# Patient Record
Sex: Male | Born: 1960 | Race: White | Hispanic: No | Marital: Married | Smoking: Former smoker
Health system: Southern US, Community
[De-identification: ages and names within clinical notes are randomized; demographics above are authoritative.]

## PROBLEM LIST (undated history)

## (undated) DIAGNOSIS — L985 Mucinosis of the skin: Secondary | ICD-10-CM

## (undated) DIAGNOSIS — D472 Monoclonal gammopathy: Secondary | ICD-10-CM

## (undated) DIAGNOSIS — R011 Cardiac murmur, unspecified: Secondary | ICD-10-CM

## (undated) DIAGNOSIS — R0602 Shortness of breath: Secondary | ICD-10-CM

## (undated) DIAGNOSIS — I340 Nonrheumatic mitral (valve) insufficiency: Secondary | ICD-10-CM

## (undated) DIAGNOSIS — I358 Other nonrheumatic aortic valve disorders: Secondary | ICD-10-CM

## (undated) HISTORY — DX: Shortness of breath: R06.02

## (undated) HISTORY — DX: Cardiac murmur, unspecified: R01.1

## (undated) HISTORY — DX: Mucinosis of the skin: L98.5

## (undated) HISTORY — DX: Nonrheumatic mitral (valve) insufficiency: I34.0

## (undated) HISTORY — DX: Monoclonal gammopathy: D47.2

## (undated) HISTORY — DX: Other nonrheumatic aortic valve disorders: I35.8

---

## 1992-03-31 HISTORY — PX: KNEE ARTHROSCOPY: SUR90

## 2002-12-05 ENCOUNTER — Emergency Department (HOSPITAL_COMMUNITY): Admission: EM | Admit: 2002-12-05 | Discharge: 2002-12-05 | Payer: Self-pay | Admitting: Emergency Medicine

## 2002-12-05 ENCOUNTER — Encounter: Payer: Self-pay | Admitting: Emergency Medicine

## 2013-08-01 ENCOUNTER — Other Ambulatory Visit: Payer: Self-pay | Admitting: Family Medicine

## 2013-08-01 DIAGNOSIS — M799 Soft tissue disorder, unspecified: Secondary | ICD-10-CM

## 2013-08-05 ENCOUNTER — Other Ambulatory Visit: Payer: Self-pay

## 2013-08-08 ENCOUNTER — Ambulatory Visit
Admission: RE | Admit: 2013-08-08 | Discharge: 2013-08-08 | Disposition: A | Discharge status: Home / Self Care | Payer: BC Managed Care – PPO | Source: Ambulatory Visit | Attending: Family Medicine | Admitting: Family Medicine

## 2013-08-08 ENCOUNTER — Other Ambulatory Visit: Payer: Self-pay | Admitting: Family Medicine

## 2013-08-08 DIAGNOSIS — M799 Soft tissue disorder, unspecified: Secondary | ICD-10-CM

## 2013-08-08 MED ORDER — IOHEXOL 300 MG/ML  SOLN
125.0000 mL | Freq: Once | INTRAMUSCULAR | Status: AC | PRN
  Administered 2013-08-08: 125 mL via INTRAVENOUS

## 2015-10-13 ENCOUNTER — Other Ambulatory Visit: Payer: Self-pay | Admitting: Family Medicine

## 2015-10-13 DIAGNOSIS — R270 Ataxia, unspecified: Secondary | ICD-10-CM

## 2015-10-29 ENCOUNTER — Ambulatory Visit
Admission: RE | Admit: 2015-10-29 | Discharge: 2015-10-29 | Disposition: A | Discharge status: Home / Self Care | Payer: 59 | Source: Ambulatory Visit | Attending: Family Medicine | Admitting: Family Medicine

## 2015-10-29 DIAGNOSIS — R270 Ataxia, unspecified: Secondary | ICD-10-CM

## 2017-07-30 ENCOUNTER — Ambulatory Visit
Admission: RE | Admit: 2017-07-30 | Discharge: 2017-07-30 | Disposition: A | Discharge status: Home / Self Care | Payer: 59 | Source: Ambulatory Visit | Attending: Family Medicine | Admitting: Family Medicine

## 2017-07-30 ENCOUNTER — Other Ambulatory Visit: Payer: Self-pay | Admitting: Family Medicine

## 2017-07-30 DIAGNOSIS — M25512 Pain in left shoulder: Secondary | ICD-10-CM

## 2017-09-29 ENCOUNTER — Ambulatory Visit
Admission: RE | Admit: 2017-09-29 | Discharge: 2017-09-29 | Disposition: A | Discharge status: Home / Self Care | Payer: 59 | Source: Ambulatory Visit | Attending: Family Medicine | Admitting: Family Medicine

## 2017-09-29 ENCOUNTER — Other Ambulatory Visit: Payer: Self-pay | Admitting: Family Medicine

## 2017-09-29 DIAGNOSIS — M79675 Pain in left toe(s): Secondary | ICD-10-CM

## 2017-10-08 ENCOUNTER — Ambulatory Visit (INDEPENDENT_AMBULATORY_CARE_PROVIDER_SITE_OTHER): Payer: 59 | Admitting: Podiatry

## 2017-10-08 ENCOUNTER — Ambulatory Visit: Payer: Self-pay

## 2017-10-08 DIAGNOSIS — S92502A Displaced unspecified fracture of left lesser toe(s), initial encounter for closed fracture: Secondary | ICD-10-CM | POA: Diagnosis not present

## 2017-10-08 DIAGNOSIS — M84378A Stress fracture, left toe(s), initial encounter for fracture: Secondary | ICD-10-CM

## 2017-10-21 NOTE — Progress Notes (Signed)
  Subjective:  Patient ID: Kirk Hayes, male    DOB: 10-02-1960,  MRN: [id number]  No chief complaint on file.  57 y.o. male presents with the above complaint.  States that he fractured his fourth and fifth toes of the left foot around 1 June.  States it is not getting better and still hurts.  States he hit his foot on table leg No past medical history on file. No current outpatient medications on file.  Allergies not on file Review of Systems: Negative except as noted in the HPI. Denies N/V/F/Ch. Objective:  There were no vitals filed for this visit. General AA&O x3. Normal mood and affect.  Vascular Dorsalis pedis and posterior tibial pulses  present 2+ bilaterally  Capillary refill normal to all digits. Pedal hair growth normal.  Neurologic Epicritic sensation grossly present.  Dermatologic No open lesions. Interspaces clear of maceration. Nails well groomed and normal in appearance.  Orthopedic: MMT 5/5 in dorsiflexion, plantarflexion, inversion, and eversion. Normal joint ROM without pain or crepitus. Pain palpation about the left fourth fifth toes   Assessment & Plan:  Patient was evaluated and treated and all questions answered.  Closed fracture left fourth fifth toes -X-rays reviewed with patient -Discussed buddy taping until pain resolves.  Return in about 6 weeks (around 11/19/2017) for Closed fracture L 4th/5th toes.Marland Kitchen

## 2017-11-19 ENCOUNTER — Ambulatory Visit: Payer: 59 | Admitting: Podiatry

## 2018-02-05 ENCOUNTER — Ambulatory Visit (INDEPENDENT_AMBULATORY_CARE_PROVIDER_SITE_OTHER): Payer: 59 | Admitting: Psychology

## 2018-02-05 DIAGNOSIS — F4323 Adjustment disorder with mixed anxiety and depressed mood: Secondary | ICD-10-CM

## 2018-02-19 ENCOUNTER — Ambulatory Visit: Payer: 59 | Admitting: Psychology

## 2018-04-12 DIAGNOSIS — I340 Nonrheumatic mitral (valve) insufficiency: Secondary | ICD-10-CM

## 2018-04-12 DIAGNOSIS — L985 Mucinosis of the skin: Secondary | ICD-10-CM

## 2018-04-12 NOTE — H&P (Signed)
Kirk Hayes 2018-04-08 10:00 AM Location: [facility] Patient #: [id number] DOB: [birth year] Married / Language: Kirk Hayes / Race: White Male   History of Present Illness [name] MD; 08-Apr-2018 10:40 AM) Patient words: f/u 1 yhear, mitral regurgitation, 2018-04-08, tests.  The patient is a 58 year old male who presents for a follow-up for Mitral regurgitation. 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  Recent echocardiogram on 03/15/2018 showed no significant change in severity of grade 3 mitral regurgitation. However, LVEF is mildly reduced around 50%. Patient continues to exercise intermittently using stationary bike and weightlifting. He occasionally walks with his doctor. He has stable exertional dyspnea but no significant change. He has had issues with his scleromyxedema with increase in rash on his skin. He currently sees Dr. Joan Mayans at [facility] for this. He has previously been treated with Revlimid and IVIG.   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 Normal axis. Normal conduction. Borderline left atrial enlargement. Otherwise normal EKG. Echocardiogram 03/15/2018: Left ventricle cavity is normal in size. LVID 3.9 cm. Mild concentric hypertrophy of the left ventricle. Mild decrease in global wall motion. Calculated EF 49%. Left atrial cavity is mildly dilated. Posterior leaflet with prolapse with eccentric, anteriorly directed, moderate (Grade III) mitral regurgitation. Mild tricuspid regurgitation. Estimated pulmonary artery systolic pressure 24 mmHg. Insignificant pericardial effusion. IVC is dilated with  respiratory variation. Estimated RA pressure 8 mmHg. Compared to previous study on 03/20/2017, while there is no significant change in severity of mitral regurgitation, LVEF is mildly reduced. Consider TEE if clinically indicated. Shortness of breath on exertion (R06.02)   Allergies Kirk Hayes [place]; Apr 08, 2018 9:58 AM) No Known Drug Allergies [09/03/2015]:  Family History Kirk Hayes; 04/08/18 9:58 AM) Mother  Deceased. at age 59; no heart issues Father  Deceased. In 07/08/99; from suicide; no signs of cardiovascular conditions Sister 59  11 years older; no signs of cardiovascular conditions Brother 1  Deceased. 73 years older; died of an overdose; doesn't know if he had any cardiovascular conditions  Social History Kirk Hayes; 04-08-2018 9:58 AM) Current tobacco use  Former smoker. Quit: 03/31/1993 Alcohol Use  Moderate alcohol use. 2-3 drinks a night Marital status  Married. Living Situation  Lives with spouse. Number of Children  3.  Past Surgical History Kirk Hayes; 2018/04/08 9:58 AM) Arthroscopic Knee Surgery - Right  25 years ago  Medication History Kirk Hayes; 04/08/2018 10:02 AM) Vitamin C (100MG  Tablet, 1 Oral daily) Active. Multivitamin Men 50+ (1 Oral daily) Active. Fish Oil + D3 (1 Oral daily) Specific strength unknown - Active. Calcium (1 Oral daily) Specific strength unknown - Active. B Complex 50 (1 Oral daily) Specific strength unknown - Active. Ativan (1MG  Tablet, 1 Oral as needed) Active. Medications Reconciled (verbally with patient)  Diagnostic Studies History Baxter Flattery [place]; 08-Apr-2018 8:20 AM) Echocardiogram  Echocardiogram 03/15/2018: Left ventricle cavity is normal in size. LVID 3.9 cm. Mild concentric hypertrophy of the left ventricle. Mild decrease in global wall motion. Calculated EF 49%. Left atrial cavity is mildly dilated. Posterior leaflet with prolapse with eccentric, anteriorly directed, moderate (Grade III) mitral  regurgitation. Mild tricuspid regurgitation. Estimated pulmonary artery systolic pressure 24 mmHg. Insignificant pericardial effusion. IVC is dilated with respiratory variation. Estimated RA  pressure 8 mmHg. Compared to previous study on 03/20/2017, while there is no significant change in severity of mitral regurgitation, LVEF is mildly reduced. Consider TEE if clinically indicated.    Review of Systems Joya Gaskins Esther Hardy MD; 03/25/2018 10:43 AM) General Not Present- Anorexia and Fever. Skin Present- Rash. Respiratory Not Present- Cough and Difficulty Breathing on Exertion. Cardiovascular Not Present- Chest Pain, Claudications, Edema, Orthopnea, Palpitations and Paroxysmal Nocturnal Dyspnea. Gastrointestinal Not Present- Black, Tarry Stool, Change in Bowel Habits and Nausea. Neurological Not Present- Syncope. Endocrine Not Present- Cold Intolerance, Excessive Sweating, Heat Intolerance and Thyroid Problems. Hematology Not Present- Anemia, Easy Bruising, Petechiae and Prolonged Bleeding.  Vitals Kirk Hayes [place]; 03/25/2018 10:05 AM) 03/25/2018 10:00 AM Weight: 257.13 lb Height: 72in Body Surface Area: 2.37 m Body Mass Index: 34.87 kg/m  Hayes: 78 (Regular)  P.OX: 96% (Room air) BP: 117/74 (Sitting, Left Arm, Standard)       Physical Exam [name] MD; 03/25/2018 10:44 AM) General Mental Status-Alert. General Appearance-Cooperative and Appears stated age. Build & Nutrition-Moderately built and Moderately obese.  Head and Neck Face -Note: Papular rash near eyelids.  Thyroid Gland Characteristics - normal size and consistency and no palpable nodules.  Chest and Lung Exam Chest and lung exam reveals -quiet, even and easy respiratory effort with no use of accessory muscles, non-tender and on auscultation, normal breath sounds, no adventitious sounds.  Cardiovascular Cardiovascular examination reveals -carotid auscultation reveals no bruits,  abdominal aorta auscultation reveals no bruits and no prominent pulsation and femoral artery auscultation bilaterally reveals normal pulses, no bruits, no thrills. Auscultation Heart Sounds - S1 WNL and S2 WNL. Murmurs & Other Heart Sounds: Murmur - Location - Apex. Timing - Holosystolic. Grade - III/VI. Character - Blowing. Radiation - Left axilla.  Abdomen Palpation/Percussion Normal exam - Non Tender and No hepatosplenomegaly.  Peripheral Vascular Lower Extremity Palpation - Dorsalis pedis Hayes - Bilateral - 2+. Posterior tibial Hayes - Bilateral - 2+. Carotid arteries - Bilateral-No Carotid bruit.  Neurologic Neurologic evaluation reveals -alert and oriented x 3 with no impairment of recent or remote memory. Motor-Grossly intact without any focal deficits.  Musculoskeletal Global Assessment Left Lower Extremity - no deformities, masses or tenderness, no known fractures. Right Lower Extremity - no deformities, masses or tenderness, no known fractures.   Results [name] MD; 03/25/2018 10:44 AM) Procedures  Name Value Date Echocardiography, transthoracic, real-time with image documentation (2D), includes M-mode recording, when performed, complete, with spectral Doppler echocardiography, and with color flow Doppler echocardiography (65993) : CAD Comments: Echocardiogram 03/15/2018: Left ventricle cavity is normal in size. LVID 3.9 cm. Mild concentric hypertrophy of the left ventricle. Mild decrease in global wall motion. Calculated EF 49%. Left atrial cavity is mildly dilated. Posterior leaflet with prolapse with eccentric, anteriorly directed, moderate (Grade III) mitral regurgitation. Mild tricuspid regurgitation. Estimated pulmonary artery systolic pressure 24 mmHg. Insignificant pericardial effusion. IVC is dilated with respiratory variation. Estimated RA pressure 8 mmHg. Compared to previous study on 03/20/2017, while there is no significant change in  severity of mitral regurgitation, LVEF is mildly reduced. Consider TEE if clinically indicated.  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 Normal axis. Normal conduction. Normal EKG.  Echocardiogram 03/15/2018: Left ventricle cavity is normal in size. LVID 3.9 cm. Mild concentric hypertrophy of the left ventricle. Mild decrease in global wall motion. Calculated EF 49%. Left atrial cavity is  mildly dilated. Posterior leaflet with prolapse with eccentric, anteriorly directed, moderate (Grade III) mitral regurgitation. Mild tricuspid regurgitation. Estimated pulmonary artery systolic pressure 24 mmHg. Insignificant pericardial effusion. IVC is dilated with respiratory variation. Estimated RA pressure 8 mmHg. Compared to previous study on 03/20/2017, while there is no significant change in severity of mitral regurgitation, LVEF is mildly reduced. Consider TEE if clinically indicated. 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 Mild stable exertional dyspnea. Mild icnrease in LVEF compared to previous echocardiogram in 02/2017. Given this change, may need to consider mitral valve repair surgery, if anatomy is suitable. I will perform TEE and refer him to cardiothoracic surgeon Dr. Roxy Manns to see if he would be a candidate for minimally invasive mitral valve repair surgery. If so, he will need preoperative heart catheterization and coronary angiography.  Scleromyxedema: Follow up with Dr. Joan Mayans  I will see him back in 3 months.  Cc Kathryne Eriksson, MD Cc Rupali Arcola Jansky, MD  Signed electronically by [name], MD  (03/25/2018 10:44 AM)

## 2018-04-13 ENCOUNTER — Ambulatory Visit (HOSPITAL_COMMUNITY)
Admission: RE | Admit: 2018-04-13 | Discharge: 2018-04-13 | Disposition: A | Discharge status: Home / Self Care | Payer: 59 | Attending: Cardiology | Admitting: Cardiology

## 2018-04-13 ENCOUNTER — Other Ambulatory Visit: Payer: Self-pay

## 2018-04-13 ENCOUNTER — Encounter (HOSPITAL_COMMUNITY)
Admission: RE | Disposition: A | Discharge status: Home / Self Care | Payer: Self-pay | Source: Home / Self Care | Attending: Cardiology

## 2018-04-13 ENCOUNTER — Encounter (HOSPITAL_COMMUNITY): Payer: Self-pay | Admitting: *Deleted

## 2018-04-13 ENCOUNTER — Ambulatory Visit (HOSPITAL_COMMUNITY): Payer: 59

## 2018-04-13 DIAGNOSIS — I058 Other rheumatic mitral valve diseases: Secondary | ICD-10-CM | POA: Diagnosis present

## 2018-04-13 DIAGNOSIS — Z87891 Personal history of nicotine dependence: Secondary | ICD-10-CM | POA: Diagnosis not present

## 2018-04-13 DIAGNOSIS — R0602 Shortness of breath: Secondary | ICD-10-CM | POA: Diagnosis not present

## 2018-04-13 DIAGNOSIS — L985 Mucinosis of the skin: Secondary | ICD-10-CM

## 2018-04-13 DIAGNOSIS — I340 Nonrheumatic mitral (valve) insufficiency: Secondary | ICD-10-CM

## 2018-04-13 DIAGNOSIS — R011 Cardiac murmur, unspecified: Secondary | ICD-10-CM | POA: Diagnosis not present

## 2018-04-13 DIAGNOSIS — I358 Other nonrheumatic aortic valve disorders: Secondary | ICD-10-CM

## 2018-04-13 DIAGNOSIS — I359 Nonrheumatic aortic valve disorder, unspecified: Secondary | ICD-10-CM | POA: Insufficient documentation

## 2018-04-13 HISTORY — DX: Other nonrheumatic aortic valve disorders: I35.8

## 2018-04-13 HISTORY — PX: TEE WITHOUT CARDIOVERSION: SHX5443

## 2018-04-13 SURGERY — ECHOCARDIOGRAM, TRANSESOPHAGEAL
Anesthesia: Moderate Sedation

## 2018-04-13 MED ORDER — BUTAMBEN-TETRACAINE-BENZOCAINE 2-2-14 % EX AERO
INHALATION_SPRAY | CUTANEOUS | Status: DC | PRN
  Administered 2018-04-13: 2 via TOPICAL

## 2018-04-13 MED ORDER — FENTANYL CITRATE (PF) 100 MCG/2ML IJ SOLN
INTRAMUSCULAR | Status: DC | PRN
  Administered 2018-04-13: 25 ug via INTRAVENOUS
  Administered 2018-04-13: 50 ug via INTRAVENOUS

## 2018-04-13 MED ORDER — SODIUM CHLORIDE 0.9 % IV SOLN
INTRAVENOUS | Status: DC
  Administered 2018-04-13: 500 mL via INTRAVENOUS

## 2018-04-13 MED ORDER — MIDAZOLAM HCL (PF) 5 MG/ML IJ SOLN
INTRAMUSCULAR | Status: AC
  Filled 2018-04-13: qty 2

## 2018-04-13 MED ORDER — MIDAZOLAM HCL 5 MG/5ML IJ SOLN
INTRAMUSCULAR | Status: DC | PRN
  Administered 2018-04-13 (×4): 1 mg via INTRAVENOUS

## 2018-04-13 MED ORDER — FENTANYL CITRATE (PF) 100 MCG/2ML IJ SOLN
INTRAMUSCULAR | Status: AC
  Filled 2018-04-13: qty 2

## 2018-04-13 NOTE — Interval H&P Note (Signed)
History and Physical Interval Note:  04/13/2018 8:35 AM  Kirk Hayes  has presented today for surgery, with the diagnosis of MITRAL REGURGITATION  The various methods of treatment have been discussed with the patient and family. After consideration of risks, benefits and other options for treatment, the patient has consented to  Procedure(s): TRANSESOPHAGEAL ECHOCARDIOGRAM (TEE) (N/A) as a surgical intervention .  The patient's history has been reviewed, patient examined, no change in status, stable for surgery.  I have reviewed the patient's chart and labs.  Questions were answered to the patient's satisfaction.     [place]

## 2018-04-13 NOTE — Progress Notes (Signed)
  Echocardiogram Echocardiogram Transesophageal has been performed.  Kirk Hayes 04/13/2018, 9:26 AM

## 2018-04-13 NOTE — CV Procedure (Signed)
TEE: Under moderate sedation, TEE was performed without complications: LV: Normal. Normal EF. RV: Normal LA: Normal. Left atrial appendage: Normal without thrombus. Normal function. Small PFO seen. RA: Normal  MV: Mod MR TV: Normal Trace TR AV: Normal. No AI or AS. PV: Normal. Trace PI.  Thoracic and ascending aorta: Normal without significant plaque or atheromatous changes.  Conscious sedation protocol was followed, I personally administered conscious sedation and monitored the patient. Patient received 4 milligrams of Versed and 75 . Patient tolerated the procedure well and there was no complication from conscious sedation. Time administered was  and procedure ended at [place], MD [facility] [place] Pager: [phone] Office: [phone] If no answer Cell [phone]

## 2018-04-13 NOTE — Discharge Instructions (Signed)
Transesophageal Echocardiogram Transesophageal echocardiogram (TEE) is a test that uses sound waves to take pictures of your heart. TEE is done by passing a flexible tube down the esophagus. The esophagus is the tube that carries food from the throat to the stomach. The pictures give detailed images of your heart. This can help your doctor see if there are problems with your heart. What happens before the procedure? Staying hydrated Follow instructions from your doctor about hydration, which may include:  Up to 3 hours before the procedure - you may continue to drink clear liquids, such as: ? Water. ? Clear fruit juice. ? Black coffee. ? Plain tea.  Eating and drinking Follow instructions from your doctor about eating and drinking, which may include:  8 hours before the procedure - stop eating heavy meals or foods such as meat, fried foods, or fatty foods.  6 hours before the procedure - stop eating light meals or foods, such as toast or cereal.  6 hours before the procedure - stop drinking milk or drinks that contain milk.  3 hours before the procedure - stop drinking clear liquids. General instructions  You will need to take out any dentures or retainers.  Plan to have someone take you home from the hospital or clinic.  If you will be going home right after the procedure, plan to have someone with you for 24 hours.  Ask your doctor about: ? Changing or stopping your normal medicines. This is important if you take diabetes medicines or blood thinners. ? Taking over-the-counter medicines, vitamins, herbs, and supplements. ? Taking medicines such as aspirin and ibuprofen. These medicines can thin your blood. Do not take these medicines unless your doctor tells you to take them. What happens during the procedure?  To lower your risk of infection, your doctors will wash or clean their hands.  An IV will be put into one of your veins.  You will be given a medicine to help you  relax (sedative).  A medicine may be sprayed or gargled. This numbs the back of your throat.  Your blood pressure, heart rate, and breathing will be watched.  You may be asked to lay on your left side.  A bite block will be placed in your mouth. This keeps you from biting the tube.  The tip of the TEE probe will be placed into the back of your mouth.  You will be asked to swallow.  Your doctor will take pictures of your heart.  The probe and bite block will be taken out. The procedure may vary among doctors and hospitals. What happens after the procedure?   Your blood pressure, heart rate, breathing rate, and blood oxygen level will be watched until the medicines you were given have worn off.  When you first wake up, your throat may feel sore and numb. This will get better over time. You will not be allowed to eat or drink until the numbness has gone away.  Do not drive for 24 hours if you were given a medicine to help you relax. Summary  TEE is a test that uses sound waves to take pictures of your heart.  You will be given a medicine to help you relax.  Do not drive for 24 hours if you were given a medicine to help you relax. This information is not intended to replace advice given to you by your health care provider. Make sure you discuss any questions you have with your health care provider. Document Released:  not drive for 24 hours if you were given a medicine to help you relax.  This information is not intended to replace advice given to you by your health care provider. Make sure you discuss any questions you have with your health care provider.  Document Released: 01/12/2009 Document Revised: 12/04/2017 Document Reviewed: 06/18/2016  Elsevier Interactive Patient Education  2019 [employer]

## 2018-04-14 ENCOUNTER — Encounter (HOSPITAL_COMMUNITY): Payer: Self-pay | Admitting: Cardiology

## 2018-04-19 ENCOUNTER — Encounter: Payer: Self-pay | Admitting: Cardiology

## 2018-04-29 ENCOUNTER — Encounter: Payer: Self-pay | Admitting: Thoracic Surgery (Cardiothoracic Vascular Surgery)

## 2018-05-10 ENCOUNTER — Encounter: Payer: Self-pay | Admitting: Thoracic Surgery (Cardiothoracic Vascular Surgery)

## 2018-05-10 ENCOUNTER — Institutional Professional Consult (permissible substitution) (INDEPENDENT_AMBULATORY_CARE_PROVIDER_SITE_OTHER): Payer: 59 | Admitting: Thoracic Surgery (Cardiothoracic Vascular Surgery)

## 2018-05-10 ENCOUNTER — Other Ambulatory Visit: Payer: Self-pay

## 2018-05-10 VITALS — BP 127/86 | HR 82 | Resp 16 | Ht 72.0 in | Wt 255.0 lb

## 2018-05-10 DIAGNOSIS — I340 Nonrheumatic mitral (valve) insufficiency: Secondary | ICD-10-CM | POA: Diagnosis not present

## 2018-05-10 NOTE — Patient Instructions (Signed)
Continue all previous medications without any changes at this time  

## 2018-05-10 NOTE — Progress Notes (Signed)
[address]       [address],K[place] [id number]             [phone]     CARDIOTHORACIC SURGERY CONSULTATION REPORT  Referring Provider is [name], [name], MD PCP is Kirk Sacramento, MD  Chief Complaint  Patient presents with  . 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  Patient states that he was told he had a heart murmur during childhood.  Several years ago he was noted to have a systolic murmur on physical exam and he was referred for cardiology consultation.  He has been followed by Dr. Einar Hayes and Dr. Virgina Hayes with serial echocardiograms that have demonstrated the presence of mitral valve prolapse with moderate mitral regurgitation.  He was seen in follow-up recently and transthoracic echocardiogram performed March 15, 2018 revealed no significant change in severity of mitral regurgitation but left ventricular function was notably reduced with ejection fraction decreased to 50%.  He subsequently underwent transesophageal echocardiogram on April 13, 2018.  This confirmed the presence of mitral valve prolapse with moderate mitral regurgitation.  There was prolapse involving the posterior leaflet and an eccentric jet of regurgitation directed anteriorly.  There was no flow reversal in the pulmonary veins and the ERO was measured 0.22 cm corresponding to a regurgitant volume estimated 33 mL using PISA.  Left ventricular function was felt to be normal with ejection fraction estimated 55 to 60%.  Transesophageal echocardiogram also revealed a mass  adherent to the ventricular surface of the right coronary leaflet of the aortic valve suspicious for papillary fibro-elastoma.  The patient was referred for surgical consultation.  Patient is married and lives locally in [place] with his wife.  They have 3 grown children.  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  He admits that he only occasionally exercises.  He does not do any sort of strenuous exercise.  He states that he has had a long history of mild symptoms of exertional shortness of breath that occur only with more strenuous physical exertion.  This is not changed in any significant degree recently.  He denies any history of chest pain or chest tightness either with activity or at rest.  He denies any history of resting shortness of breath, PND, orthopnea, or lower extremity edema.  He has not had palpitations, dizzy spells, nor syncope.  He has not had any transient neurologic events such as visual disturbances or transient numbness or weakness involving either side of his body.  He does have mild discomfort in his hands related to nodular fibrotic changes that have been attributed to his underlying history of scleromyxedema.  He has been seeing an oncologist and contemplating taking intravenous Ig for treatment in the near future.  Past Medical History:  Diagnosis Date  . 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 Mitral regurgitation   . Scleromyxedema    DR. Garden Hayes     Past Surgical History:  Procedure Laterality Date  . KNEE ARTHROSCOPY Right 1994  . TEE WITHOUT CARDIOVERSION N/A 04/13/2018   Procedure:  TRANSESOPHAGEAL ECHOCARDIOGRAM (TEE);  Surgeon: Kirk Mormon, MD;  Location: [facility]COPY;  Service: Cardiovascular;  Laterality: N/A;    History reviewed. No pertinent family history.  Social History   Socioeconomic History  . Marital status: Married    Spouse name: Not on file  . Number of children:  Not on file  . Years of education: Not on file  . Highest education level: Not on file  Occupational History  . Not on file  Social Needs  . Financial resource strain: Not on file  . Food insecurity:    Worry: Not on file    Inability: Not on file  . Transportation needs:    Medical: Not on file    Non-medical: Not on file  Tobacco Use  . Smoking status: Former Smoker    Packs/day: 0.50    Years: 15.00    Pack years: 7.50    Last attempt to quit: 05/10/1993    Years since quitting: 25.0  . Smokeless tobacco: Never Used  Substance and Sexual Activity  . Alcohol use: Yes    Alcohol/week: 3.0 standard drinks    Types: 3 Cans of beer per week    Comment: 3-4 PER DAY  . Drug use: Not on file  . Sexual activity: Not on file  Lifestyle  . Physical activity:    Days per week: Not on file    Minutes per session: Not on file  . Stress: Not on file  Relationships  . Social connections:    Talks on phone: Not on file    Gets together: Not on file    Attends religious service: Not on file    Active member of club or organization: Not on file    Attends meetings of clubs or organizations: Not on file    Relationship status: Not on file  . Intimate partner violence:    Fear of current or ex partner: Not on file    Emotionally abused: Not on file    Physically abused: Not on file    Forced sexual activity: Not on file  Other Topics Concern  . Not on file  Social History Narrative  . Not on file    Current Outpatient Medications  Medication Sig Dispense Refill  . acetaminophen (TYLENOL) 500 MG tablet Take 500 mg by mouth daily as needed for moderate pain or headache.    Marland Kitchen LORazepam (ATIVAN) 1 MG tablet Take 1 mg by mouth at bedtime as needed for anxiety or sleep.     . Multiple Vitamins-Minerals (MULTIVITAMIN WITH MINERALS) tablet Take 1 tablet by mouth daily.    . tadalafil (CIALIS) 10 MG tablet Take 10 mg by mouth daily as needed for erectile dysfunction.    . vitamin C  (ASCORBIC ACID) 500 MG tablet Take 500 mg by mouth daily.     No current facility-administered medications for this visit.     No Known Allergies    Review of Systems:   General:  normal appetite, decreased energy, no weight gain, no weight loss, no fever  Cardiac:  no chest pain with exertion, no chest pain at rest, +SOB with more strenuous exertion, no resting SOB, no PND, no orthopnea, no palpitations, no arrhythmia, no atrial fibrillation, no LE edema, no dizzy spells, no syncope  Respiratory:  no shortness of breath, no home oxygen, no productive cough, no dry cough, no bronchitis, no wheezing, no hemoptysis, no asthma, no pain with inspiration or cough, no sleep apnea, no CPAP at  night  GI:   no difficulty swallowing, no reflux, no frequent heartburn, no hiatal hernia, no abdominal pain, no constipation, no diarrhea, no hematochezia, no hematemesis, no melena  GU:   no dysuria,  no frequency, no urinary tract infection, no hematuria, no enlarged prostate, no kidney stones, no kidney disease  Vascular:  no pain suggestive of claudication, no pain in feet, occasional leg cramps, no varicose veins, no DVT, no non-healing foot ulcer  Neuro:   no stroke, no TIA's, no seizures, no headaches, + occasional occular migraines, no temporary blindness one eye,  no slurred speech, + peripheral neuropathy, no chronic pain, no instability of gait, no memory/cognitive dysfunction  Musculoskeletal: + arthritis, no joint swelling, no myalgias, no difficulty walking, no mobility   Skin:   no rash, no itching, no skin infections, no pressure sores or ulcerations  Psych:   no anxiety, + depression, no nervousness, no unusual recent stress  Eyes:   no blurry vision, + floaters, no recent vision changes, + wears glasses or contacts  ENT:   no hearing loss, no loose or painful teeth, no dentures, last saw dentist 2018  Hematologic:  no easy bruising, no abnormal bleeding, no clotting disorder, no frequent  epistaxis  Endocrine:  no diabetes, does not check CBG's at home     Physical Exam:   BP 127/86 (BP Location: Left Arm, Patient Position: Sitting, Cuff Size: Large)   Pulse 82   Resp 16   Ht 6' (1.829 m)   Wt 255 lb (115.7 kg)   SpO2 97% Comment: ON RA  BMI 34.58 kg/m   General:    well-appearing  HEENT:  Unremarkable   Neck:   no JVD, no bruits, no adenopathy   Chest:   clear to auscultation, symmetrical breath sounds, no wheezes, no rhonchi   CV:   RRR, grade II-III/VI holosystolic murmur at apex   Abdomen:  soft, non-tender, no masses   Extremities:  warm, well-perfused, pulses palpable, no LE edema  Rectal/GU  Deferred  Neuro:   Grossly non-focal and symmetrical throughout  Skin:   Clean and dry, no rashes, no breakdown   Diagnostic Tests:  Transesophageal Echocardiography  Patient:    Kirk Hayes, Kirk Hayes MR #:       [id number] Study Date: 04/13/2018 Gender:     M Age:        62 Height:     182.9 cm Weight:     122.5 kg BSA:        2.54 m^2 Pt. Status: Room:   SONOGRAPHER  Chelsea Androw  ADMITTING    Vernell Leep, MD  Searcy, MD  ORDERING     Vernell Leep, MD  PERFORMING   Vernell Leep, MD  REFERRING    Vernell Leep, MD  cc:  ------------------------------------------------------------------- LV EF: 55% -   60%  ------------------------------------------------------------------- Indications:      Mitral regurgitation 424.0.  ------------------------------------------------------------------- History:   PMH:  No prior cardiac history.  ------------------------------------------------------------------- Study Conclusions  - Left ventricle: Systolic function was normal. The estimated   ejection fraction was in the range of 55% to 60%. Wall motion was   normal; there were no regional wall motion abnormalities. - Aortic valve: Trileaflet aortic valve with fimbriated echodense,   mobile mass, seen  attached to ventricular side of the right   coronary cusp. Most likely differential is papillary   fibroelastoma. There was no significant regurgitation. - Mitral valve: Posterior leaflet prolapse with  eccentric,   anteriorly directed, moderate mitral regurgitation. No flow   reversal seen in pulmonary veins.  ------------------------------------------------------------------- Study data:   Study status:  Routine.  Consent:  The risks, benefits, and alternatives to the procedure were explained to the patient and informed consent was obtained.  Procedure:  The patient reported no pain pre or post test. Initial setup. The patient was brought to the laboratory. Surface ECG leads were monitored. Sedation. Conscious sedation was administered by cardiology staff. Transesophageal echocardiography. Topical anesthesia was obtained using viscous lidocaine. An adult multiplane transesophageal probe was inserted by the attending cardiologistwithout difficulty. Image quality was adequate.  Study completion:  The patient tolerated the procedure well. There were no complications.  Administered medications:   Fentanyl, 62mcg.  Midazolam, 4mg . Diagnostic transesophageal echocardiography.  2D and color Doppler.  Birthdate:  Patient birthdate: [birth year].  Age:  Patient is 59 yr old.  Sex:  Gender: male.    BMI: 36.6 kg/m^2.  Blood pressure: 121/91  Patient status:  Inpatient.  Study date:  Study date: 04/13/2018. Study time: 08:17 AM.  Location:  Endoscopy.  -------------------------------------------------------------------  ------------------------------------------------------------------- Left ventricle:  Systolic function was normal. The estimated ejection fraction was in the range of 55% to 60%. Wall motion was normal; there were no regional wall motion abnormalities.  ------------------------------------------------------------------- Aortic valve:  Trileaflet aortic valve with fimbriated  echodense, mobile mass, seen attached to ventricular side of the right coronary cusp. 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  Doppler:  There was no significant regurgitation.  ------------------------------------------------------------------- Mitral valve:  Posterior leaflet prolapse with eccentric, anteriorly directed, moderate mitral regurgitation. No flow reversal seen in pulmonary veins.  ------------------------------------------------------------------- Left atrium:  The atrium was normal in size. The appendage was morphologically a left appendage.  ------------------------------------------------------------------- Atrial septum:  Thin interatrial septum with small PFO seen on agitated saline contrast study.  ------------------------------------------------------------------- Right ventricle:  Systolic function was normal.  ------------------------------------------------------------------- Pulmonic valve:    Structurally normal valve.    Doppler:  There was no significant regurgitation.  ------------------------------------------------------------------- Tricuspid valve:   Structurally normal valve.    Doppler:  There was no significant regurgitation.  ------------------------------------------------------------------- Right atrium:  The atrium was normal in size.  ------------------------------------------------------------------- Pericardium:  The pericardium was normal in appearance.  ------------------------------------------------------------------- Measurements   Aorta                                    Value  Ascending aorta ID, A-P, S               28    mm    Mitral valve                             Value  Mitral maximal regurg velocity, PISA     525   cm/s  Mitral regurg VTI, PISA                  152   cm  Mitral ERO, PISA                         0.22  cm^2  Mitral regurg volume, PISA               33    ml  Legend: (L)   and  (H)  mark values outside specified reference range.  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  He describes stable symptoms of exertional shortness of breath and fatigue which could be signs of symptomatic mitral regurgitation.  I have personally reviewed the patient's recent transesophageal echocardiogram.  The patient has myxomatous degenerative disease of the mitral valve with a combination of type I and type II dysfunction.  There is at least moderate mitral regurgitation related to prolapse involving a portion of the posterior leaflet.  Left ventricular size and systolic function appear normal.  Based upon review of the patient's TEE mitral valve repair appears feasible with very high likelihood of success and expectations for durable long-term result.  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  Because of the mobile characteristics of this mass the patient is likely at significant risk for embolization.  I agree it would make sense to proceed with surgical resection of the aortic valve mass and mitral valve repair.   Plan:  The patient and his wife were counseled at length regarding the indications, risks and potential benefits of resection of aortic valve mass and mitral valve repair.  The rationale for elective surgery has been explained, including a comparison between surgery and continued medical therapy with  close follow-up.  Concerns about the mobile nature of the aortic valve mass and associated risk of embolization were discussed.  The likelihood of successful and durable mitral valve repair has been discussed with particular reference to the findings of their recent echocardiogram.  Based upon these findings and previous experience, I have quoted them a greater than 95 percent likelihood of successful mitral valve repair.  The need for definitive surgical resection of the aortic valve mass was discussed including the possibility that aortic valve repair or replacement could be necessary.  Alternative surgical approaches have been discussed including a comparison between conventional sternotomy and minimally-invasive techniques.  The relative risks and benefits of each have been reviewed as they pertain to the patient's specific circumstances, and all of their questions have been addressed.  Expectations for the patient's postoperative convalescence have been discussed.    The patient is interested in proceeding with elective surgical intervention in the near future.  He has been scheduled for diagnostic cardiac catheterization later this week.  He and his wife will discuss matters further and contact our office when he is ready to proceed with surgery.    I spent in excess of 90 minutes during the conduct of this office consultation and >50% of this time involved direct face-to-face encounter with the patient for counseling and/or coordination of their care.    Valentina Gu. Roxy Manns, MD 05/10/2018 11:34 AM

## 2018-05-11 ENCOUNTER — Other Ambulatory Visit: Payer: Self-pay

## 2018-05-11 ENCOUNTER — Encounter (HOSPITAL_COMMUNITY)
Admission: RE | Disposition: A | Discharge status: Home / Self Care | Payer: Self-pay | Source: Home / Self Care | Attending: Cardiology

## 2018-05-11 ENCOUNTER — Encounter (HOSPITAL_COMMUNITY): Payer: Self-pay | Admitting: Cardiology

## 2018-05-11 ENCOUNTER — Ambulatory Visit (HOSPITAL_COMMUNITY)
Admission: RE | Admit: 2018-05-11 | Discharge: 2018-05-11 | Disposition: A | Discharge status: Home / Self Care | Payer: 59 | Attending: Cardiology | Admitting: Cardiology

## 2018-05-11 DIAGNOSIS — I359 Nonrheumatic aortic valve disorder, unspecified: Secondary | ICD-10-CM | POA: Diagnosis present

## 2018-05-11 DIAGNOSIS — Z79899 Other long term (current) drug therapy: Secondary | ICD-10-CM | POA: Diagnosis not present

## 2018-05-11 DIAGNOSIS — L985 Mucinosis of the skin: Secondary | ICD-10-CM | POA: Diagnosis present

## 2018-05-11 DIAGNOSIS — D472 Monoclonal gammopathy: Secondary | ICD-10-CM | POA: Insufficient documentation

## 2018-05-11 DIAGNOSIS — Z8249 Family history of ischemic heart disease and other diseases of the circulatory system: Secondary | ICD-10-CM | POA: Insufficient documentation

## 2018-05-11 DIAGNOSIS — I051 Rheumatic mitral insufficiency: Secondary | ICD-10-CM | POA: Insufficient documentation

## 2018-05-11 DIAGNOSIS — Z87891 Personal history of nicotine dependence: Secondary | ICD-10-CM | POA: Insufficient documentation

## 2018-05-11 DIAGNOSIS — I340 Nonrheumatic mitral (valve) insufficiency: Secondary | ICD-10-CM | POA: Diagnosis present

## 2018-05-11 DIAGNOSIS — I069 Rheumatic aortic valve disease, unspecified: Secondary | ICD-10-CM | POA: Insufficient documentation

## 2018-05-11 HISTORY — PX: LEFT HEART CATH AND CORONARY ANGIOGRAPHY: CATH118249

## 2018-05-11 LAB — BASIC METABOLIC PANEL
Anion gap: 10 (ref 5–15)
BUN: 15 mg/dL (ref 6–20)
CALCIUM: 9 mg/dL (ref 8.9–10.3)
CO2: 26 mmol/L (ref 22–32)
Chloride: 107 mmol/L (ref 98–111)
Creatinine, Ser: 0.97 mg/dL (ref 0.61–1.24)
GFR calc Af Amer: >60 mL/min (ref >60)
GFR calc non Af Amer: >60 mL/min (ref >60)
Glucose, Bld: 97 mg/dL (ref 70–99)
Potassium: 3.8 mmol/L (ref 3.5–5.1)
Sodium: 143 mmol/L (ref 135–145)

## 2018-05-11 LAB — CBC
HEMATOCRIT: 48.9 % (ref 39.0–52.0)
Hemoglobin: 16 g/dL (ref 13.0–17.0)
MCH: 32.1 pg (ref 26.0–34.0)
MCHC: 32.7 g/dL (ref 30.0–36.0)
MCV: 98.2 fL (ref 80.0–100.0)
Platelets: 204 10*3/uL (ref 150–400)
RBC: 4.98 MIL/uL (ref 4.22–5.81)
RDW: 13.4 % (ref 11.5–15.5)
WBC: 5.5 10*3/uL (ref 4.0–10.5)
nRBC: 0 % (ref 0.0–0.2)

## 2018-05-11 SURGERY — LEFT HEART CATH AND CORONARY ANGIOGRAPHY
Anesthesia: LOCAL

## 2018-05-11 MED ORDER — ASPIRIN 81 MG PO CHEW
81.0000 mg | CHEWABLE_TABLET | ORAL | Status: AC
  Administered 2018-05-11: 81 mg via ORAL
  Filled 2018-05-11: qty 1

## 2018-05-11 MED ORDER — FENTANYL CITRATE (PF) 100 MCG/2ML IJ SOLN
INTRAMUSCULAR | Status: DC | PRN
  Administered 2018-05-11: 25 ug via INTRAVENOUS
  Administered 2018-05-11: 50 ug via INTRAVENOUS

## 2018-05-11 MED ORDER — HEPARIN SODIUM (PORCINE) 1000 UNIT/ML IJ SOLN
INTRAMUSCULAR | Status: DC | PRN
  Administered 2018-05-11: 6000 [IU] via INTRAVENOUS

## 2018-05-11 MED ORDER — SODIUM CHLORIDE 0.9% FLUSH: 3.0000 mL | Freq: Two times a day (BID) | INTRAVENOUS | Status: DC

## 2018-05-11 MED ORDER — SODIUM CHLORIDE 0.9% FLUSH: 3.0000 mL | INTRAVENOUS | Status: DC | PRN

## 2018-05-11 MED ORDER — VERAPAMIL HCL 2.5 MG/ML IV SOLN
INTRAVENOUS | Status: DC | PRN
  Administered 2018-05-11: 15:00:00 via INTRA_ARTERIAL

## 2018-05-11 MED ORDER — HEPARIN (PORCINE) IN NACL 1000-0.9 UT/500ML-% IV SOLN
INTRAVENOUS | Status: AC
  Filled 2018-05-11: qty 1000

## 2018-05-11 MED ORDER — HEPARIN (PORCINE) IN NACL 1000-0.9 UT/500ML-% IV SOLN
INTRAVENOUS | Status: DC | PRN
  Administered 2018-05-11: 500 mL

## 2018-05-11 MED ORDER — MIDAZOLAM HCL 2 MG/2ML IJ SOLN
INTRAMUSCULAR | Status: DC | PRN
  Administered 2018-05-11 (×2): 1 mg via INTRAVENOUS

## 2018-05-11 MED ORDER — SODIUM CHLORIDE 0.9 % IV SOLN: 250.0000 mL | INTRAVENOUS | Status: DC | PRN

## 2018-05-11 MED ORDER — SODIUM CHLORIDE 0.9 % WEIGHT BASED INFUSION
3.0000 mL/kg/h | INTRAVENOUS | Status: AC
  Administered 2018-05-11: 3 mL/kg/h via INTRAVENOUS

## 2018-05-11 MED ORDER — FENTANYL CITRATE (PF) 100 MCG/2ML IJ SOLN
INTRAMUSCULAR | Status: AC
  Filled 2018-05-11: qty 2

## 2018-05-11 MED ORDER — MIDAZOLAM HCL 2 MG/2ML IJ SOLN
INTRAMUSCULAR | Status: AC
  Filled 2018-05-11: qty 2

## 2018-05-11 MED ORDER — ONDANSETRON HCL 4 MG/2ML IJ SOLN: 4.0000 mg | Freq: Four times a day (QID) | INTRAMUSCULAR | Status: DC | PRN

## 2018-05-11 MED ORDER — LIDOCAINE HCL (PF) 1 % IJ SOLN
INTRAMUSCULAR | Status: AC
  Filled 2018-05-11: qty 30

## 2018-05-11 MED ORDER — SODIUM CHLORIDE 0.9 % IV SOLN: INTRAVENOUS | Status: AC

## 2018-05-11 MED ORDER — LIDOCAINE HCL (PF) 1 % IJ SOLN
INTRAMUSCULAR | Status: DC | PRN
  Administered 2018-05-11: 2 mL

## 2018-05-11 MED ORDER — SODIUM CHLORIDE 0.9 % WEIGHT BASED INFUSION: 1.0000 mL/kg/h | INTRAVENOUS | Status: DC

## 2018-05-11 MED ORDER — IOHEXOL 350 MG/ML SOLN
INTRAVENOUS | Status: DC | PRN
  Administered 2018-05-11: 75 mL via INTRA_ARTERIAL

## 2018-05-11 MED ORDER — ACETAMINOPHEN 325 MG PO TABS: 650.0000 mg | ORAL_TABLET | ORAL | Status: DC | PRN

## 2018-05-11 MED ORDER — VERAPAMIL HCL 2.5 MG/ML IV SOLN
INTRAVENOUS | Status: AC
  Filled 2018-05-11: qty 2

## 2018-05-11 SURGICAL SUPPLY — 13 items
CATH INFINITI 5 FR 3DRC (CATHETERS) ×2 IMPLANT
CATH INFINITI 5 FR JL3.5 (CATHETERS) ×2 IMPLANT
CATH INFINITI 5FR AL1 (CATHETERS) ×2 IMPLANT
CATH INFINITI JR4 5F (CATHETERS) ×2 IMPLANT
CATH OPTITORQUE TIG 4.0 5F (CATHETERS) ×2 IMPLANT
DEVICE RAD COMP TR BAND LRG (VASCULAR PRODUCTS) ×2 IMPLANT
GLIDESHEATH SLEND A-KIT 6F 22G (SHEATH) ×2 IMPLANT
GUIDEWIRE INQWIRE 1.5J.035X260 (WIRE) ×1 IMPLANT
INQWIRE 1.5J .035X260CM (WIRE) ×2
KIT HEART LEFT (KITS) ×2 IMPLANT
PACK CARDIAC CATHETERIZATION (CUSTOM PROCEDURE TRAY) ×2 IMPLANT
TRANSDUCER W/STOPCOCK (MISCELLANEOUS) ×2 IMPLANT
TUBING CIL FLEX 10 FLL-RA (TUBING) ×2 IMPLANT

## 2018-05-11 NOTE — Discharge Instructions (Signed)
Radial Site Care ° °This sheet gives you information about how to care for yourself after your procedure. Your health care provider may also give you more specific instructions. If you have problems or questions, contact your health care provider. °What can I expect after the procedure? °After the procedure, it is common to have: °· Bruising and tenderness at the catheter insertion area. °Follow these instructions at home: °Medicines °· Take over-the-counter and prescription medicines only as told by your health care provider. °Insertion site care °· Follow instructions from your health care provider about how to take care of your insertion site. Make sure you: °? Wash your hands with soap and water before you change your bandage (dressing). If soap and water are not available, use hand sanitizer. °? Change your dressing as told by your health care provider. °? Leave stitches (sutures), skin glue, or adhesive strips in place. These skin closures may need to stay in place for 2 weeks or longer. If adhesive strip edges start to loosen and curl up, you may trim the loose edges. Do not remove adhesive strips completely unless your health care provider tells you to do that. °· Check your insertion site every day for signs of infection. Check for: °? Redness, swelling, or pain. °? Fluid or blood. °? Pus or a bad smell. °? Warmth. °· Do not take baths, swim, or use a hot tub until your health care provider approves. °· You may shower 24-48 hours after the procedure, or as directed by your health care provider. °? Remove the dressing and gently wash the site with plain soap and water. °? Pat the area dry with a clean towel. °? Do not rub the site. That could cause bleeding. °· Do not apply powder or lotion to the site. °Activity ° °· For 24 hours after the procedure, or as directed by your health care provider: °? Do not flex or bend the affected arm. °? Do not push or pull heavy objects with the affected arm. °? Do not  drive yourself home from the hospital or clinic. You may drive 24 hours after the procedure unless your health care provider tells you not to. °? Do not operate machinery or power tools. °· Do not lift anything that is heavier than 10 lb (4.5 kg), or the limit that you are told, until your health care provider says that it is safe. °· Ask your health care provider when it is okay to: °? Return to work or school. °? Resume usual physical activities or sports. °? Resume sexual activity. °General instructions °· If the catheter site starts to bleed, raise your arm and put firm pressure on the site. If the bleeding does not stop, get help right away. This is a medical emergency. °· If you went home on the same day as your procedure, a responsible adult should be with you for the first 24 hours after you arrive home. °· Keep all follow-up visits as told by your health care provider. This is important. °Contact a health care provider if: °· You have a fever. °· You have redness, swelling, or yellow drainage around your insertion site. °Get help right away if: °· You have unusual pain at the radial site. °· The catheter insertion area swells very fast. °· The insertion area is bleeding, and the bleeding does not stop when you hold steady pressure on the area. °· Your arm or hand becomes pale, cool, tingly, or numb. °These symptoms may represent a serious problem   that is an emergency. Do not wait to see if the symptoms will go away. Get medical help right away. Call your local emergency services (911 in the [place] Do not drive yourself to the hospital. °Summary °· After the procedure, it is common to have bruising and tenderness at the site. °· Follow instructions from your health care provider about how to take care of your radial site wound. Check the wound every day for signs of infection. °· Do not lift anything that is heavier than 10 lb (4.5 kg), or the limit that you are told, until your health care provider says  that it is safe. °This information is not intended to replace advice given to you by your health care provider. Make sure you discuss any questions you have with your health care provider. °Document Released: 04/19/2010 Document Revised: 04/22/2017 Document Reviewed: 04/22/2017 °Elsevier Interactive Patient Education © 2019 [employer] ° ° ° °Moderate Conscious Sedation, Adult, Care After °These instructions provide you with information about caring for yourself after your procedure. Your health care provider may also give you more specific instructions. Your treatment has been planned according to current medical practices, but problems sometimes occur. Call your health care provider if you have any problems or questions after your procedure. °What can I expect after the procedure? °After your procedure, it is common: °· To feel sleepy for several hours. °· To feel clumsy and have poor balance for several hours. °· To have poor judgment for several hours. °· To vomit if you eat too soon. °Follow these instructions at home: °For at least 24 hours after the procedure: ° °· Do not: °? Participate in activities where you could fall or become injured. °? Drive. °? Use heavy machinery. °? Drink alcohol. °? Take sleeping pills or medicines that cause drowsiness. °? Make important decisions or sign legal documents. °? Take care of children on your own. °· Rest. °Eating and drinking °· Follow the diet recommended by your health care provider. °· If you vomit: °? Drink water, juice, or soup when you can drink without vomiting. °? Make sure you have little or no nausea before eating solid foods. °General instructions °· Have a responsible adult stay with you until you are awake and alert. °· Take over-the-counter and prescription medicines only as told by your health care provider. °· If you smoke, do not smoke without supervision. °· Keep all follow-up visits as told by your health care provider. This is  important. °Contact a health care provider if: °· You keep feeling nauseous or you keep vomiting. °· You feel light-headed. °· You develop a rash. °· You have a fever. °Get help right away if: °· You have trouble breathing. °This information is not intended to replace advice given to you by your health care provider. Make sure you discuss any questions you have with your health care provider. °Document Released: 01/05/2013 Document Revised: 08/20/2015 Document Reviewed: 07/07/2015 °Elsevier Interactive Patient Education © 2019 [employer] ° °

## 2018-05-11 NOTE — H&P (Signed)
Kirk Hayes is an 58 y.o. male.   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 Patient will be undergoing surgical resection of aortic valve mass and mitral valve repair by Dr. Roxy Manns. He is here today for pre-op diagnostic coronary angiogram.   Past Medical History:  Diagnosis Date  . 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 Mitral regurgitation   . Scleromyxedema    DR. [place]     Past Surgical History:  Procedure Laterality Date  . KNEE ARTHROSCOPY Right 1994  . TEE WITHOUT CARDIOVERSION N/A 04/13/2018   Procedure: TRANSESOPHAGEAL ECHOCARDIOGRAM (TEE);  Surgeon: [name], MD;  Location: [facility] ENDOSCOPY;  Service: Cardiovascular;  Laterality: N/A;    Family History  Problem Relation Age of Onset  . Heart disease Mother    Social History:  reports that he quit smoking about 25 years ago. He has a 7.50 pack-year smoking history. He has never used smokeless tobacco. He reports current alcohol use of about 3.0 standard drinks of alcohol per week. No history on file for drug.  Allergies: No Known Allergies  Review of Systems  Constitution: Negative for decreased appetite, malaise/fatigue, weight gain and weight loss.  HENT: Negative for congestion.   Eyes: Negative for visual disturbance.  Cardiovascular: Negative for chest pain, dyspnea on exertion, leg swelling, palpitations and syncope.  Respiratory: Negative for shortness of breath.   Endocrine: Negative for cold intolerance.  Hematologic/Lymphatic: Does not bruise/bleed easily.  Skin: Negative for itching and rash.  Musculoskeletal: Negative for myalgias.  Gastrointestinal: Negative for abdominal pain, nausea and vomiting.  Genitourinary: Negative for dysuria.  Neurological: Negative for dizziness and weakness.  Psychiatric/Behavioral: The  patient is not nervous/anxious.   All other systems reviewed and are negative.    There were no vitals taken for this visit. There is no height or weight on file to calculate BMI.  Physical Exam  Constitutional: He is oriented to person, place, and time. He appears well-developed and well-nourished. No distress.  HENT:  Head: Normocephalic and atraumatic.  Eyes: Pupils are equal, round, and reactive to light. Conjunctivae are normal.  Neck: No JVD present.  Cardiovascular: Normal rate, regular rhythm and intact distal pulses.  Murmur (III/VI apical holosystolic murmur) heard. Pulmonary/Chest: Effort normal and breath sounds normal. He has no wheezes. He has no rales.  Abdominal: Soft. Bowel sounds are normal. There is no rebound.  Musculoskeletal:        General: No edema.  Lymphadenopathy:    He has no cervical adenopathy.  Neurological: He is alert and oriented to person, place, and time. No cranial nerve deficit.  Skin: Skin is warm and dry.  Psychiatric: He has a normal mood and affect.  Nursing note and vitals reviewed.   No results found for this or any previous visit (from the past 48 hour(s)).  Labs 05/05/2018:  Glucose 98. BUN/Cr 27/0.94. eGFR normal. Na/K 139/4.4Rest of the CMP normal.  H/H 15/46. MCV 96. Platelets 227.   Medications Prior to Admission  Medication Sig Dispense Refill  . acetaminophen (TYLENOL) 500 MG tablet Take 500 mg by mouth daily as needed for moderate pain or headache.    Marland Kitchen LORazepam (ATIVAN) 1 MG tablet Take 1 mg by mouth at bedtime as needed for anxiety or sleep.     . Multiple Vitamins-Minerals (MULTIVITAMIN WITH MINERALS) tablet Take 1 tablet by mouth daily.    Marland Kitchen  vitamin C (ASCORBIC ACID) 500 MG tablet Take 500 mg by mouth daily.    . tadalafil (CIALIS) 10 MG tablet Take 10 mg by mouth daily as needed for erectile dysfunction.       No current facility-administered medications for this encounter.   CARDIAC STUDIES:  EKG  03/25/2018: Sinus rhythm. 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  Plan: Diagnostic coronary angiogram  [name], MD 05/11/2018, 11:14 AM [facility] Pager: [phone] Office: [phone] If no answer: Cell:  [phone]

## 2018-05-11 NOTE — Progress Notes (Signed)
Attempted iv 4 times, three diff nurses, called jennifer rn cath lab notified, one IV will be ok.

## 2018-05-12 ENCOUNTER — Encounter (HOSPITAL_COMMUNITY): Payer: Self-pay | Admitting: Cardiology

## 2018-05-18 NOTE — Progress Notes (Signed)
Patient is here for follow up visit.  Subjective:   _0  ID: Kirk Hayes, male    DOB: 09-Aug-1960, 58 y.o.   MRN: [id number]  Chief Complaint  Patient presents with  . 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   Patient recently underwent coronary angiogram which showed normal coronaries. He has met with Dr. Roxy Manns and discussed mitral valve repair and resecrion of aortic valve mass. Patint stated that he would proceed with this in near future, but had not made a decision.  He is here for post cath follow up. He has mild pain in his right wrist, but denies any swelling. He has had stable exertional dyspnea with no major changes. He is seeking a second opinion tomorrow at [facility].   Past Medical History:  Diagnosis Date  . 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 Mitral regurgitation   . Scleromyxedema    DR. [place]     Past Surgical History:  Procedure Laterality Date  . KNEE ARTHROSCOPY Right 1994  . LEFT HEART CATH AND CORONARY ANGIOGRAPHY N/A 05/11/2018   Procedure: LEFT HEART CATH AND CORONARY ANGIOGRAPHY;  Surgeon: [name], MD;  Location: [place] [facility];  Service: Cardiovascular;  Laterality: N/A;  . TEE WITHOUT CARDIOVERSION N/A 04/13/2018   Procedure: TRANSESOPHAGEAL ECHOCARDIOGRAM (TEE);  Surgeon: [name], MD;  Location: [facility] ENDOSCOPY;  Service: Cardiovascular;  Laterality: N/A;    Social History   Socioeconomic History  . Marital status: Married    Spouse name: Not on file  . Number of children: Not on file  . Years of education: Not on file  . Highest education level: Not on file  Occupational History  . Not on file  Social Needs  . Financial resource strain: Not on file  . Food insecurity:    Worry: Not on file    Inability: Not on file    . Transportation needs:    Medical: Not on file    Non-medical: Not on file  Tobacco Use  . Smoking status: Former Smoker    Packs/day: 0.50    Years: 15.00    Pack years: 7.50    Last attempt to quit: 05/10/1993    Years since quitting: 25.0  . Smokeless tobacco: Never Used  Substance and Sexual Activity  . Alcohol use: Yes    Alcohol/week: 3.0 standard drinks    Types: 3 Cans of beer per week    Comment: 3-4 PER DAY  . Drug use: Not on file  . Sexual activity: Not on file  Lifestyle  . Physical activity:    Days per week: Not on file    Minutes per session: Not on file  . Stress: Not on file  Relationships  . Social connections:    Talks on phone: Not on file    Gets together: Not on file    Attends religious service: Not on file    Active member of club or organization: Not on file    Attends meetings of clubs or organizations: Not on file    Relationship status: Not on file  . Intimate partner violence:    Fear of current or ex partner: Not on file    Emotionally abused: Not on file    Physically abused: Not on file    Forced  sexual activity: Not on file  Other Topics Concern  . Not on file  Social History Narrative  . Not on file    Current Outpatient Medications on File Prior to Visit  Medication Sig Dispense Refill  . acetaminophen (TYLENOL) 500 MG tablet Take 500 mg by mouth daily as needed for moderate pain or headache.    Marland Kitchen LORazepam (ATIVAN) 1 MG tablet Take 1 mg by mouth at bedtime as needed for anxiety or sleep.     . Multiple Vitamins-Minerals (MULTIVITAMIN WITH MINERALS) tablet Take 1 tablet by mouth daily.    . tadalafil (CIALIS) 10 MG tablet Take 10 mg by mouth daily as needed for erectile dysfunction.    . vitamin C (ASCORBIC ACID) 500 MG tablet Take 500 mg by mouth daily.     No current facility-administered medications on file prior to visit.     Cardiovascular studies:  Cath 05/11/2018: Left dominant coronary circulation with no  significant disease LVEDP mildly elevated.  TEE 04/13/2018: - Left ventricle: Systolic function was normal. The estimated   ejection fraction was in the range of 55% to 60%. Wall motion was   normal; there were no regional wall motion abnormalities. - Aortic valve: Trileaflet aortic valve with fimbriated echodense,   mobile mass, seen attached to ventricular side of the right   coronary cusp. Most likely differential is papillary   fibroelastoma. There was no significant regurgitation. - Mitral valve: Posterior leaflet prolapse with eccentric,   anteriorly directed, moderate mitral regurgitation. No flow   reversal seen in pulmonary veins.  Review of Systems  Constitution: Negative for decreased appetite, malaise/fatigue, weight gain and weight loss.  HENT: Negative for congestion.   Eyes: Negative for visual disturbance.  Cardiovascular: Negative for chest pain, dyspnea on exertion, leg swelling, palpitations and syncope.  Respiratory: Positive for shortness of breath.   Endocrine: Negative for cold intolerance.  Hematologic/Lymphatic: Does not bruise/bleed easily.  Skin: Negative for itching and rash.  Musculoskeletal: Negative for myalgias.  Gastrointestinal: Negative for abdominal pain, nausea and vomiting.  Genitourinary: Negative for dysuria.  Neurological: Negative for dizziness and weakness.  Psychiatric/Behavioral: The patient is not nervous/anxious.   All other systems reviewed and are negative.      Objective:   Vitals:   05/19/18 1340  BP: 125/86  Pulse: 72  SpO2: 99%     Physical Exam  Constitutional: He is oriented to person, place, and time. He appears well-developed and well-nourished. No distress.  HENT:  Head: Normocephalic and atraumatic.  Eyes: Pupils are equal, round, and reactive to light. Conjunctivae are normal.  Neck: No JVD present.  Cardiovascular: Normal rate, regular rhythm and intact distal pulses.  Murmur (III/VI apical holosystolic  murmur) heard. Pulmonary/Chest: Effort normal and breath sounds normal. He has no wheezes. He has no rales.  Abdominal: Soft. Bowel sounds are normal. There is no rebound.  Musculoskeletal:        General: No edema.  Lymphadenopathy:    He has no cervical adenopathy.  Neurological: He is alert and oriented to person, place, and time. No cranial nerve deficit.  Skin: Skin is warm and dry.  Psychiatric: He has a normal mood and affect.  Nursing note and vitals reviewed.       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   Moderate mitral regurgitation: Stable, mildly symptomatic.   Aortic valve mass: Mot likely papillary  fibroelastoma.   Cardiothoracic surgeon Dr. Roxy Manns has discussed surgical options with the patient. In isolation, mitral regurgitation is moderate and may not need immediate repair. In conjunction with the possibility of papillary fibroelastoma and risk of thromboembolism, he offered mitral valve repair, resection of aortic valve mass, with the possibility of requiring mitral and tricuspid valve replacement. Mr. Hakeem is going to seek another opinion at [facility]. I will be happy share the records to facilitate this.  I will see him back in 6 months.    [name], MD [facility] Pager: [phone] Office: [phone] If no answer Cell [phone]

## 2018-05-19 ENCOUNTER — Encounter: Payer: Self-pay | Admitting: Cardiology

## 2018-05-19 ENCOUNTER — Ambulatory Visit (INDEPENDENT_AMBULATORY_CARE_PROVIDER_SITE_OTHER): Payer: 59 | Admitting: Cardiology

## 2018-05-19 VITALS — BP 125/86 | HR 72 | Ht 72.0 in | Wt 255.0 lb

## 2018-05-19 DIAGNOSIS — I340 Nonrheumatic mitral (valve) insufficiency: Secondary | ICD-10-CM | POA: Diagnosis not present

## 2018-05-19 DIAGNOSIS — I359 Nonrheumatic aortic valve disorder, unspecified: Secondary | ICD-10-CM | POA: Diagnosis not present

## 2018-06-21 ENCOUNTER — Ambulatory Visit: Payer: Self-pay | Admitting: Cardiology

## 2018-07-06 ENCOUNTER — Telehealth: Payer: Self-pay

## 2018-07-06 NOTE — Telephone Encounter (Signed)
Pt called wanting to let you know that he decided to hold off on the referral you did to the cardiac surgeon. He has also been having pvc's and wants to know if he can see you sooner to discuss.//ah

## 2018-07-07 NOTE — Telephone Encounter (Signed)
LM with woman for pt to return call.//ah

## 2018-07-07 NOTE — Telephone Encounter (Signed)
Pt aware. JG scheduled.//ah

## 2018-07-07 NOTE — Telephone Encounter (Signed)
Please offer him virtual visit.  Thanks MJP

## 2018-07-14 ENCOUNTER — Ambulatory Visit (INDEPENDENT_AMBULATORY_CARE_PROVIDER_SITE_OTHER): Payer: 59 | Admitting: Cardiology

## 2018-07-14 ENCOUNTER — Encounter: Payer: Self-pay | Admitting: Cardiology

## 2018-07-14 ENCOUNTER — Other Ambulatory Visit: Payer: Self-pay

## 2018-07-14 VITALS — BP 133/90 | HR 87 | Ht 72.0 in | Wt 257.0 lb

## 2018-07-14 DIAGNOSIS — I359 Nonrheumatic aortic valve disorder, unspecified: Secondary | ICD-10-CM | POA: Diagnosis not present

## 2018-07-14 DIAGNOSIS — I340 Nonrheumatic mitral (valve) insufficiency: Secondary | ICD-10-CM | POA: Diagnosis not present

## 2018-07-14 DIAGNOSIS — L985 Mucinosis of the skin: Secondary | ICD-10-CM

## 2018-07-14 DIAGNOSIS — I493 Ventricular premature depolarization: Secondary | ICD-10-CM | POA: Diagnosis not present

## 2018-07-14 MED ORDER — METOPROLOL TARTRATE 25 MG PO TABS: 25.0000 mg | ORAL_TABLET | Freq: Two times a day (BID) | ORAL | 3 refills | Status: DC

## 2018-07-14 NOTE — Progress Notes (Signed)
Patient is here for follow up visit.  Subjective:   @Patient  ID: Kirk Hayes, male    DOB: 1960-09-19, 58 y.o.   MRN: [id number]   I connected with the patient on 07/14/2018 by a video enabled telemedicine application and verified that I am speaking with the correct person using two identifiers.     I discussed the limitations of evaluation and management by telemedicine and the availability of in person appointments. The patient expressed understanding and agreed to proceed.   This visit type was conducted due to national recommendations for restrictions regarding the COVID-19 Pandemic (e.g. social distancing).  This format is felt to be most appropriate for this patient at this time.  All issues noted in this document were discussed and addressed.  No physical exam was performed (except for noted visual exam findings with Tele health visits).  The patient has consented to conduct a Tele health visit and understands insurance will be billed.   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   Since his last visit with me, he saw cardiothoracic surgeon at [facility], Dr. Berneice Gandy who agreed with our finding that his aortic mass is probably papillary fibroelastoma, that will likely need resection at some point due to the risk of embolization. However, this would require perioperative interruption of IVIG therapy to reduce infection risk. Patient has recently been started on 6  Months of IVIG IVIG therapy with improvement in his symptoms of scleromyxydema, thus reluctant to stop this. Thus, it was decided to delay the surgery for now.  In the meantime, patient was noted to have PVC's on routine EKG performed during his  IVIG sessions. Patient reports episodes of palpitations and occasional episodes of sharp chest pains. Symptoms are unrelated to  activity.    Past Medical History:  Diagnosis Date  . 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 Mitral regurgitation   . Scleromyxedema    DR. [place]   . Shortness of breath on exertion   . Systolic murmur     Past Surgical History:  Procedure Laterality Date  . KNEE ARTHROSCOPY Right 1994  . LEFT HEART CATH AND CORONARY ANGIOGRAPHY N/A 05/11/2018   Procedure: LEFT HEART CATH AND CORONARY ANGIOGRAPHY;  Surgeon: [name], MD;  Location: [place] [facility];  Service: Cardiovascular;  Laterality: N/A;  . TEE WITHOUT CARDIOVERSION N/A 04/13/2018   Procedure: TRANSESOPHAGEAL ECHOCARDIOGRAM (TEE);  Surgeon: [name], MD;  Location: [facility] ENDOSCOPY;  Service: Cardiovascular;  Laterality: N/A;    Social History   Socioeconomic History  . Marital status: Married    Spouse name: Not on file  . Number of children: 3  . Years of education: Not on file  . Highest education level: Not on file  Occupational History  . Not on file  Social Needs  . Financial resource strain: Not on file  . Food insecurity:    Worry: Not on file    Inability: Not on file  . Transportation needs:    Medical: Not on file    Non-medical: Not on file  Tobacco Use  . Smoking status: Former Smoker    Packs/day: 0.50    Years: 15.00    Pack years: 7.50    Types: Cigarettes    Last attempt to quit: 05/10/1993    Years since quitting: 25.1  . Smokeless tobacco: Never  Used  Substance and Sexual Activity  . Alcohol use: Yes    Alcohol/week: 3.0 standard drinks    Types: 3 Cans of beer per week    Comment: 3-4 PER DAY  . Drug use: Never  . Sexual activity: Not on file  Lifestyle  . Physical activity:    Days per week: Not on file    Minutes per session: Not on file  . Stress: Not on file  Relationships  . Social connections:    Talks on phone: Not on file    Gets together: Not on file    Attends religious service: Not on file    Active member of club or  organization: Not on file    Attends meetings of clubs or organizations: Not on file    Relationship status: Not on file  . Intimate partner violence:    Fear of current or ex partner: Not on file    Emotionally abused: Not on file    Physically abused: Not on file    Forced sexual activity: Not on file  Other Topics Concern  . Not on file  Social History Narrative  . Not on file    Current Outpatient Medications on File Prior to Visit  Medication Sig Dispense Refill  . acetaminophen (TYLENOL) 500 MG tablet Take 500 mg by mouth daily as needed for moderate pain or headache.    Marland Kitchen LORazepam (ATIVAN) 1 MG tablet Take 1 mg by mouth at bedtime as needed for anxiety or sleep.     . Multiple Vitamins-Minerals (MULTIVITAMIN WITH MINERALS) tablet Take 1 tablet by mouth daily.    . tadalafil (CIALIS) 10 MG tablet Take 10 mg by mouth daily as needed for erectile dysfunction.    . vitamin C (ASCORBIC ACID) 500 MG tablet Take 500 mg by mouth daily.     No current facility-administered medications on file prior to visit.     Cardiovascular studies:  Cath 05/11/2018: Left dominant coronary circulation with no significant disease LVEDP mildly elevated.  TEE 04/13/2018: - Left ventricle: Systolic function was normal. The estimated   ejection fraction was in the range of 55% to 60%. Wall motion was   normal; there were no regional wall motion abnormalities. - Aortic valve: Trileaflet aortic valve with fimbriated echodense,   mobile mass, seen attached to ventricular side of the right   coronary cusp. Most likely differential is papillary   fibroelastoma. There was no significant regurgitation. - Mitral valve: Posterior leaflet prolapse with eccentric,   anteriorly directed, moderate mitral regurgitation. No flow   reversal seen in pulmonary veins.  Review of Systems  Constitution: Negative for decreased appetite, malaise/fatigue, weight gain and weight loss.  HENT: Negative for  congestion.   Eyes: Negative for visual disturbance.  Cardiovascular: Negative for chest pain, dyspnea on exertion, leg swelling, palpitations and syncope.  Respiratory: Positive for shortness of breath.   Endocrine: Negative for cold intolerance.  Hematologic/Lymphatic: Does not bruise/bleed easily.  Skin: Negative for itching and rash.  Musculoskeletal: Negative for myalgias.  Gastrointestinal: Negative for abdominal pain, nausea and vomiting.  Genitourinary: Negative for dysuria.  Neurological: Negative for dizziness and weakness.  Psychiatric/Behavioral: The patient is not nervous/anxious.   All other systems reviewed and are negative.      Objective:   Vitals:   07/14/18 1035  BP: 133/90  Pulse: 87      Physical Exam  Constitutional: He is oriented to person, place, and time. He appears well-developed and well-nourished. No  distress.  Pulmonary/Chest: Effort normal.  Musculoskeletal:        General: No edema.  Neurological: He is alert and oriented to person, place, and time.  Psychiatric: He has a normal mood and affect.  Nursing note and vitals reviewed.       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   PVC's: Symptomatic. Recommend metoprolol 25 mg bid. Patient is unsure if he wants to start the treatment now. At next visit, I will assess his symptoms, and consider Holter monitor to assess PVC burden.   Moderate mitral regurgitation: Stable, mildly symptomatic.   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 Another questions is, could the echocardiogram findings be related to his scleromyxedema?. I did not come across any specific case reports of valvular manifestation of scleromyxedema. However, I wonder if mucocutaneous involvement could be linked to his mitral and aortic valve findings. to I will obtain a  cardiac MRI in June, after COVID outbreak settles down, for further delineation of his aortic and mitral valve. Until then, reasonable to continue Aspirin as at least some form of antithrombotic therapy to reduce embolization risk. Nonetheless, it is reasonable to hold any surgical procedures until after IVIG treatment is over.   Follow up after MRI in July/August.   [name], MD [facility] [place] Pager: [phone] Office: [phone] If no answer Cell [phone]

## 2018-10-07 ENCOUNTER — Other Ambulatory Visit: Payer: Self-pay

## 2018-10-07 ENCOUNTER — Ambulatory Visit (HOSPITAL_COMMUNITY)
Admission: RE | Admit: 2018-10-07 | Discharge: 2018-10-07 | Disposition: A | Discharge status: Home / Self Care | Payer: 59 | Source: Ambulatory Visit | Attending: Cardiology | Admitting: Cardiology

## 2018-10-07 DIAGNOSIS — I359 Nonrheumatic aortic valve disorder, unspecified: Secondary | ICD-10-CM

## 2018-10-07 LAB — CREATININE, SERUM
Creatinine, Ser: 1.07 mg/dL (ref 0.61–1.24)
GFR calc Af Amer: >60 mL/min (ref >60)
GFR calc non Af Amer: >60 mL/min (ref >60)

## 2018-10-07 MED ORDER — GADOBUTROL 1 MMOL/ML IV SOLN
14.0000 mL | Freq: Once | INTRAVENOUS | Status: AC | PRN
  Administered 2018-10-07: 14 mL via INTRAVENOUS

## 2018-10-11 ENCOUNTER — Ambulatory Visit: Payer: 59 | Admitting: Cardiology

## 2018-10-21 ENCOUNTER — Ambulatory Visit (INDEPENDENT_AMBULATORY_CARE_PROVIDER_SITE_OTHER): Payer: 59 | Admitting: Cardiology

## 2018-10-21 ENCOUNTER — Other Ambulatory Visit: Payer: Self-pay

## 2018-10-21 ENCOUNTER — Encounter: Payer: Self-pay | Admitting: Cardiology

## 2018-10-21 VITALS — BP 139/87 | HR 82 | Temp 98.0°F | Ht 72.0 in | Wt 260.0 lb

## 2018-10-21 DIAGNOSIS — I340 Nonrheumatic mitral (valve) insufficiency: Secondary | ICD-10-CM

## 2018-10-21 DIAGNOSIS — L985 Mucinosis of the skin: Secondary | ICD-10-CM | POA: Diagnosis not present

## 2018-10-21 DIAGNOSIS — I493 Ventricular premature depolarization: Secondary | ICD-10-CM | POA: Diagnosis not present

## 2018-10-21 DIAGNOSIS — I359 Nonrheumatic aortic valve disorder, unspecified: Secondary | ICD-10-CM

## 2018-10-21 NOTE — Progress Notes (Signed)
Patient is here for follow up visit.  Subjective:   @Patient  ID: Kirk Hayes, male    DOB: 1960-05-06, 58 y.o.   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   Patient underwent cardiac MRI, which was unconvincing for aortic mass, although mild flow disturbance was noted at aortic valve. Patient has been undergoing IVIG treatment for his scleromyxedema with significant improvement. He denies chest pain, shortness of breath, leg edema, orthopnea, PND, TIA/syncope. He has occasional palpitations, which previously have been correlated with PVCs on EKG.   Past Medical History:  Diagnosis Date  . 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 Mitral regurgitation   . Scleromyxedema    DR. BHAVE   . Shortness of breath on exertion   . Systolic murmur     Past Surgical History:  Procedure Laterality Date  . KNEE ARTHROSCOPY Right 1994  . LEFT HEART CATH AND CORONARY ANGIOGRAPHY N/A 05/11/2018   Procedure: LEFT HEART CATH AND CORONARY ANGIOGRAPHY;  Surgeon: [name], MD;  Location: [facility];  Service: Cardiovascular;  Laterality: N/A;  . TEE WITHOUT CARDIOVERSION N/A 04/13/2018   Procedure: TRANSESOPHAGEAL ECHOCARDIOGRAM (TEE);  Surgeon: [name], MD;  Location: [facility];  Service: Cardiovascular;  Laterality: N/A;    Social History   Socioeconomic History  . Marital status: Married    Spouse name: Not on file  . Number of children: 3  . Years of education: Not on file  . Highest education level: Not on file  Occupational History  . Not on file  Social Needs  . Financial resource strain: Not on file  . Food insecurity    Worry: Not on file    Inability: Not on file  . Transportation needs    Medical: Not on file    Non-medical: Not on file   Tobacco Use  . Smoking status: Former Smoker    Packs/day: 0.50    Years: 15.00    Pack years: 7.50    Types: Cigarettes    Quit date: 05/10/1993    Years since quitting: 25.4  . Smokeless tobacco: Never Used  Substance and Sexual Activity  . Alcohol use: Yes    Alcohol/week: 3.0 standard drinks    Types: 3 Cans of beer per week    Comment: 3-4 PER DAY  . Drug use: Never  . Sexual activity: Not on file  Lifestyle  . Physical activity    Days per week: Not on file    Minutes per session: Not on file  . Stress: Not on file  Relationships  . Social Herbalist on phone: Not on file    Gets together: Not on file    Attends religious service: Not on file    Active member of club or organization: Not on file    Attends meetings of clubs or organizations: Not on file    Relationship status: Not on file  . Intimate partner violence    Fear of current or ex partner: Not on file    Emotionally abused: Not on file    Physically abused: Not on file    Forced sexual activity: Not on file  Other Topics Concern  . Not on file  Social History Narrative  . Not on file    Current Outpatient Medications on  File Prior to Visit  Medication Sig Dispense Refill  . acetaminophen (TYLENOL) 500 MG tablet Take 500 mg by mouth daily as needed for moderate pain or headache.    Marland Kitchen aspirin EC 81 MG tablet Take 81 mg by mouth daily.    Marland Kitchen LORazepam (ATIVAN) 1 MG tablet Take 1 mg by mouth at bedtime as needed for anxiety or sleep.     . metoprolol tartrate (LOPRESSOR) 25 MG tablet Take 1 tablet (25 mg total) by mouth 2 (two) times daily. 180 tablet 3  . Multiple Vitamins-Minerals (MULTIVITAMIN WITH MINERALS) tablet Take 1 tablet by mouth daily.    . tadalafil (CIALIS) 10 MG tablet Take 10 mg by mouth daily as needed for erectile dysfunction.    . vitamin C (ASCORBIC ACID) 500 MG tablet Take 500 mg by mouth daily.     No current facility-administered medications on file prior to visit.      Cardiovascular studies:  Cardiac MRI 10/07/2018: 1.  Borderline dilated LV with EF 56%, normal wall motion. 2.  Normal RV size and systolic function, EF 35%. 3. No myocardial LGE, so no definitive evidence for prior MI, infiltrative disease, or myocarditis. 4. I cannot identify a definite aortic valve lesion on this study. If there is a small fibroelastoma on a moving valve, this study likely lacks appropriate temporal resolution and TEE would be a better way to investigate. There is one view where turbulence is noted across the valve and may represent the area of abnormality. 5. Posterior mitral leaflet prolapse with highly eccentric anterior MR, looks moderate.  Cath 05/11/2018: Left dominant coronary circulation with no significant disease LVEDP mildly elevated.  TEE 04/13/2018: - Left ventricle: Systolic function was normal. The estimated   ejection fraction was in the range of 55% to 60%. Wall motion was   normal; there were no regional wall motion abnormalities. - Aortic valve: Trileaflet aortic valve with fimbriated echodense,   mobile mass, seen attached to ventricular side of the right   coronary cusp. Most likely differential is papillary   fibroelastoma. There was no significant regurgitation. - Mitral valve: Posterior leaflet prolapse with eccentric,   anteriorly directed, moderate mitral regurgitation. No flow   reversal seen in pulmonary veins.  Review of Systems  Constitution: Negative for decreased appetite, malaise/fatigue, weight gain and weight loss.  HENT: Negative for congestion.   Eyes: Negative for visual disturbance.  Cardiovascular: Negative for chest pain, dyspnea on exertion, leg swelling, palpitations and syncope.  Respiratory: Positive for shortness of breath.   Endocrine: Negative for cold intolerance.  Hematologic/Lymphatic: Does not bruise/bleed easily.  Skin: Negative for itching and rash.  Musculoskeletal: Negative for myalgias.   Gastrointestinal: Negative for abdominal pain, nausea and vomiting.  Genitourinary: Negative for dysuria.  Neurological: Negative for dizziness and weakness.  Psychiatric/Behavioral: The patient is not nervous/anxious.   All other systems reviewed and are negative.      Objective:   Vitals:   10/21/18 1024  BP: 139/87  Pulse: 82  Temp: 98 F (36.7 C)  SpO2: 99%      Physical Exam  Constitutional: He is oriented to person, place, and time. He appears well-developed and well-nourished. No distress.  HENT:  Head: Normocephalic and atraumatic.  Eyes: Pupils are equal, round, and reactive to light. Conjunctivae are normal.  Neck: No JVD present.  Cardiovascular: Normal rate, regular rhythm and intact distal pulses.  Murmur heard. High-pitched blowing holosystolic murmur is present at the apex. Pulmonary/Chest: Effort normal and breath  sounds normal. He has no wheezes. He has no rales.  Abdominal: Soft. Bowel sounds are normal. There is no rebound.  Musculoskeletal:        General: No edema.  Lymphadenopathy:    He has no cervical adenopathy.  Neurological: He is alert and oriented to person, place, and time. No cranial nerve deficit.  Skin: Skin is warm and dry.  Psychiatric: He has a normal mood and affect.  Nursing note and vitals reviewed.        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    Moderate mitral regurgitation: Stable, mildly symptomatic.  Repeat echocardiogram in 07/2019.  PVC's: Symptomatic. Not on any treatment at this time, as per patient's wishes.  Will obtain Holter monitor 24 hr reading to assess PVC burden.   Aortic valve mass: On TEE, there was suspicion of papillary fibroelastoma on ventricular side. This is only visible in certain views. Cardiac MRI did not show any conclusive evidence of any  aortic valve mass. Even if present, papillary fibroelastoma is likely small, which can be missed on MRI.  It is also possible that this is just redundant aortic valve leaflet. Although I have not come across any previous case reports to suggest this, It is plausible that some of this could be related mucin deposition from scleromyxedema. Mucin deposition in myocardium and coronary arteries has been documented previously. JAAD: Vol 69:1; 66-72.  Overall, I feel his embolic risk is low. In absence of any bleeding issues, it is reasonable to continue Aspirin 81 mg daily. I will continue surveillance for his mitral valve prolapse and regurgitation.   F/u in 07/2019 after echocardiogram.   [name], MD [facility] [place] Pager: [phone] Office: [phone] If no answer Cell [phone]

## 2018-10-29 ENCOUNTER — Other Ambulatory Visit: Payer: Self-pay

## 2018-10-29 ENCOUNTER — Ambulatory Visit: Payer: 59

## 2018-10-29 DIAGNOSIS — I493 Ventricular premature depolarization: Secondary | ICD-10-CM

## 2018-11-03 DIAGNOSIS — I493 Ventricular premature depolarization: Secondary | ICD-10-CM

## 2018-11-09 NOTE — Progress Notes (Signed)
Holter monitor 10/29/2018: Dominant rhythm sinus.  Ventricular ectopy in the form of PVCs, couplets, and trigeminy seen.  13% ventricular ectopy burden.  Rare supraventricular ectopy.  No atrial fibrillation, atrial flutter, > 3 sec pauses, high-grade AV block seen.

## 2018-11-15 ENCOUNTER — Telehealth: Payer: Self-pay

## 2018-11-15 DIAGNOSIS — I493 Ventricular premature depolarization: Secondary | ICD-10-CM

## 2018-11-15 NOTE — Telephone Encounter (Signed)
Spoke with the patient. 13% PVC burden with few symptoms. Patient will start taking metoprolol tartarate 25 mg bid previously prescribed to him. Will repeat Holter monitor in 3 months (ordered), followed by office visit (needs to be scheduled).  Thanks MJP

## 2018-11-15 NOTE — Telephone Encounter (Signed)
Telephone encounter:  Reason for call: Patient called for monitor results      Usual provider: MP  Last office visit: 10/29/18  Next office visit: 08/11/19   Last hospitalization: na   Current Outpatient Medications on File Prior to Visit  Medication Sig Dispense Refill  . acetaminophen (TYLENOL) 500 MG tablet Take 500 mg by mouth daily as needed for moderate pain or headache.    Marland Kitchen aspirin EC 81 MG tablet Take 81 mg by mouth daily.    Marland Kitchen LORazepam (ATIVAN) 1 MG tablet Take 1 mg by mouth at bedtime as needed for anxiety or sleep.     . Multiple Vitamins-Minerals (MULTIVITAMIN WITH MINERALS) tablet Take 1 tablet by mouth daily.    . tadalafil (CIALIS) 10 MG tablet Take 10 mg by mouth daily as needed for erectile dysfunction.    . vitamin C (ASCORBIC ACID) 500 MG tablet Take 500 mg by mouth daily.     No current facility-administered medications on file prior to visit.

## 2018-11-18 ENCOUNTER — Ambulatory Visit: Payer: 59 | Admitting: Cardiology

## 2019-02-14 ENCOUNTER — Ambulatory Visit: Payer: 59

## 2019-02-14 ENCOUNTER — Other Ambulatory Visit: Payer: Self-pay

## 2019-02-14 DIAGNOSIS — I493 Ventricular premature depolarization: Secondary | ICD-10-CM

## 2019-02-15 DIAGNOSIS — I493 Ventricular premature depolarization: Secondary | ICD-10-CM

## 2019-02-27 NOTE — Progress Notes (Signed)
Patient is here for follow up visit.  Subjective:   @Patient  ID: Kirk Hayes, male    DOB: May 17, 1960, 58 y.o.   MRN: [id number]   I connected with the patient on 02/28/2019 by a telephone call and verified that I am speaking with the correct person using two identifiers.     I offered the patient a video enabled application for a virtual visit. Unfortunately, this could not be accomplished due to technical difficulties/lack of video enabled phone/computer. I discussed the limitations of evaluation and management by telemedicine and the availability of in person appointments. The patient expressed understanding and agreed to proceed.   This visit type was conducted due to national recommendations for restrictions regarding the COVID-19 Pandemic (e.g. social distancing).  This format is felt to be most appropriate for this patient at this time.  All issues noted in this document were discussed and addressed.  No physical exam was performed (except for noted visual exam findings with Tele health visits).  The patient has consented to conduct a Tele health visit and understands insurance will be billed.    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    Recent Holter monitor showed PVC burden down from 13% to 3%.  Since being on metoprolol tartrate, patient has experienced fatigue and lethargy symptoms.  He denies any exertional dyspnea, orthopnea, PND, leg edema symptoms.  His treatment of scleromyxedema is ongoing with Dr. Joan Mayans at [facility] with intermittent IVIG transfusions.  Today, patient asks if we should do a repeat TEE at some point to reassess the aortic valve findings.   Past Medical History:  Diagnosis Date  . 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 Mitral regurgitation   . Scleromyxedema    DR. BHAVE   . Shortness of breath on exertion   . Systolic murmur     Past Surgical History:  Procedure Laterality Date  . KNEE ARTHROSCOPY Right 1994  . LEFT HEART CATH AND CORONARY ANGIOGRAPHY N/A 05/11/2018   Procedure: LEFT HEART CATH AND CORONARY ANGIOGRAPHY;  Surgeon: [name], MD;  Location: [place] [facility];  Service: Cardiovascular;  Laterality: N/A;  . TEE WITHOUT CARDIOVERSION N/A 04/13/2018   Procedure: TRANSESOPHAGEAL ECHOCARDIOGRAM (TEE);  Surgeon: [name], MD;  Location: [facility];  Service: Cardiovascular;  Laterality: N/A;    Social History   Socioeconomic History  . Marital status: Married    Spouse name: Not on file  . Number of children: 3  . Years of education: Not on file  . Highest education level: Not on file  Occupational History  . Not on file  Social Needs  . Financial resource strain: Not on file  . Food insecurity    Worry: Not on file    Inability: Not on file  . Transportation needs    Medical: Not on file    Non-medical: Not on file  Tobacco Use  . Smoking status: Former Smoker    Packs/day: 0.50    Years: 15.00    Pack years: 7.50    Types: Cigarettes    Quit date: 05/10/1993    Years since quitting: 25.8  . Smokeless tobacco: Never Used  Substance and Sexual Activity  . Alcohol use: Yes    Alcohol/week: 3.0 standard drinks    Types: 3 Cans of beer per week    Comment:  3-4 PER DAY  . Drug use: Never  . Sexual activity: Not on file  Lifestyle  . Physical activity    Days per week: Not on file    Minutes per session: Not on file  . Stress: Not on file  Relationships  . Social Herbalist on phone: Not on file    Gets together: Not on file    Attends religious service: Not on file    Active member of club or organization: Not on file    Attends meetings of clubs or organizations: Not on file    Relationship status: Not on file  . Intimate partner violence    Fear of current or ex partner:  Not on file    Emotionally abused: Not on file    Physically abused: Not on file    Forced sexual activity: Not on file  Other Topics Concern  . Not on file  Social History Narrative  . Not on file    Current Outpatient Medications on File Prior to Visit  Medication Sig Dispense Refill  . acetaminophen (TYLENOL) 500 MG tablet Take 500 mg by mouth daily as needed for moderate pain or headache.    Marland Kitchen aspirin EC 81 MG tablet Take 81 mg by mouth daily.    Marland Kitchen LORazepam (ATIVAN) 1 MG tablet Take 1 mg by mouth at bedtime as needed for anxiety or sleep.     . Multiple Vitamins-Minerals (MULTIVITAMIN WITH MINERALS) tablet Take 1 tablet by mouth daily.    . tadalafil (CIALIS) 10 MG tablet Take 10 mg by mouth daily as needed for erectile dysfunction.    . vitamin C (ASCORBIC ACID) 500 MG tablet Take 500 mg by mouth daily.     No current facility-administered medications on file prior to visit.     Cardiovascular studies:  24-hour Holter monitor 03/04/2019:  Baseline rhythm sinus. Heart rate 46 left 129 bpm. Average heart rate 71 bpm. 3.3% ventricular ectopy, mostly single PVCs, occasional trigeminy. Rare supraventricular ectopy. No other arrhythmia noted.   Cardiac MRI 10/07/2018: 1.  Borderline dilated LV with EF 56%, normal wall motion. 2.  Normal RV size and systolic function, EF [id number]. 3. No myocardial LGE, so no definitive evidence for prior MI, infiltrative disease, or myocarditis. 4. I cannot identify a definite aortic valve lesion on this study. If there is a small fibroelastoma on a moving valve, this study likely lacks appropriate temporal resolution and TEE would be a better way to investigate. There is one view where turbulence is noted across the valve and may represent the area of abnormality. 5. Posterior mitral leaflet prolapse with highly eccentric anterior MR, looks moderate.  Cath 05/11/2018: Left dominant coronary circulation with no significant disease LVEDP  mildly elevated.  TEE 04/13/2018: - Left ventricle: Systolic function was normal. The estimated   ejection fraction was in the range of 55% to 60%. Wall motion was   normal; there were no regional wall motion abnormalities. - Aortic valve: Trileaflet aortic valve with fimbriated echodense,   mobile mass, seen attached to ventricular side of the right   coronary cusp. Most likely differential is papillary   fibroelastoma. There was no significant regurgitation. - Mitral valve: Posterior leaflet prolapse with eccentric,   anteriorly directed, moderate mitral regurgitation. No flow   reversal seen in pulmonary veins.  Review of Systems  Constitution: Negative for decreased appetite, malaise/fatigue, weight gain and weight loss.  HENT: Negative for congestion.   Eyes: Negative for  visual disturbance.  Cardiovascular: Negative for chest pain, dyspnea on exertion, leg swelling, palpitations and syncope.  Respiratory: Positive for shortness of breath.   Endocrine: Negative for cold intolerance.  Hematologic/Lymphatic: Does not bruise/bleed easily.  Skin: Negative for itching and rash.  Musculoskeletal: Negative for myalgias.  Gastrointestinal: Negative for abdominal pain, nausea and vomiting.  Genitourinary: Negative for dysuria.  Neurological: Negative for dizziness and weakness.  Psychiatric/Behavioral: The patient is not nervous/anxious.   All other systems reviewed and are negative.      Objective:   Vitals:   02/28/19 0951  BP: 125/87  Pulse: 65      Physical Exam   Not performed. Telephone visit.       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    Moderate mitral regurgitation: Stable, mildly symptomatic.  Repeat echocardiogram in 07/2019.  PVC's: Symptomatic. PVC burden improved with metoprolol tartrate 25 mg twice  daily.  However, given patient's fatigue symptoms, will reduce this to metoprolol succinate 25 mg once daily.  Aortic valve mass: On TEE, there was suspicion of papillary fibroelastoma on ventricular side. This is only visible in certain views. Cardiac MRI did not show any conclusive evidence of any aortic valve mass. Even if present, papillary fibroelastoma is likely small, which can be missed on MRI.  It is also possible that this is just redundant aortic valve leaflet. Although I have not come across any previous case reports to suggest this, It is plausible that some of this could be related mucin deposition from scleromyxedema. Mucin deposition in myocardium and coronary arteries has been documented previously. JAAD: Vol 69:1; 66-72.  Overall, I feel his embolic risk is low. In absence of any bleeding issues, it is reasonable to continue Aspirin 81 mg daily. I will continue surveillance for his mitral valve prolapse and regurgitation.  Patient is concerned about any possibility of increase in the size of the stated finding on aortic valve.  He requests if we could reassess this with TEE.  Given that the finding was not noted on transthoracic echocardiogram, I think it is reasonable to repeat TEE at some point, given that there is no acute clinical change and urgency for this, I propose to perform repeat TEE in May 2021 to reduce Covid risk related to TEE.   F/u in 07/2019   [name], MD [facility] [place] Pager: [phone] Office: [phone] If no answer Cell [phone]

## 2019-02-28 ENCOUNTER — Other Ambulatory Visit: Payer: Self-pay

## 2019-02-28 ENCOUNTER — Telehealth (INDEPENDENT_AMBULATORY_CARE_PROVIDER_SITE_OTHER): Payer: 59 | Admitting: Cardiology

## 2019-02-28 VITALS — BP 125/87 | HR 65

## 2019-02-28 DIAGNOSIS — I340 Nonrheumatic mitral (valve) insufficiency: Secondary | ICD-10-CM | POA: Diagnosis not present

## 2019-02-28 DIAGNOSIS — I493 Ventricular premature depolarization: Secondary | ICD-10-CM | POA: Diagnosis not present

## 2019-02-28 DIAGNOSIS — L985 Mucinosis of the skin: Secondary | ICD-10-CM

## 2019-02-28 DIAGNOSIS — I359 Nonrheumatic aortic valve disorder, unspecified: Secondary | ICD-10-CM

## 2019-02-28 MED ORDER — METOPROLOL SUCCINATE ER 25 MG PO TB24: 25.0000 mg | ORAL_TABLET | Freq: Every day | ORAL | 2 refills | Status: DC

## 2019-05-18 ENCOUNTER — Telehealth: Payer: Self-pay

## 2019-05-18 NOTE — Telephone Encounter (Signed)
Patient wants to know if he should be having a TEE before his F/U visit on 08/11/19. He is scheduled for a ECHO.

## 2019-05-18 NOTE — Telephone Encounter (Signed)
Supposed to be TEE. Leda Quail, can you check?

## 2019-05-20 NOTE — Telephone Encounter (Signed)
[place] office echo in that case, check with insurance company, if approved-then schedule TEE. Keep me posted.  Thanks MJP

## 2019-05-20 NOTE — Telephone Encounter (Signed)
01/2019 check out note states: Will order TEE for May 2020. Keep echo appt for now, in case insurance does not approve TEE. Keep May f/u for now.   I need to call insurance closer to the time at least a month before,- Insurance will provide an "effective date" If I do it now, it may expire by May.   He is on my list to follow up with.   Thank you  -Leda Quail

## 2019-05-23 NOTE — Telephone Encounter (Signed)
TEE for 06/07/19 @ 9am  DX: I34.0/ I35.9   Covid test 06/02/19 @ 9am   FU: 06/20/19 @ 3pm   Patient aware, thank you.

## 2019-05-23 NOTE — Telephone Encounter (Signed)
Thank you :)

## 2019-05-23 NOTE — Telephone Encounter (Signed)
Yes please. Diagnosis: Mitral regurgitation, aortic valve disorder.

## 2019-05-23 NOTE — Telephone Encounter (Signed)
Patients insurance [employer] does not require prior auth for TEE cpt code [phone]-   Should I schedule TEE for March?    Thank you -Leda Quail

## 2019-06-02 ENCOUNTER — Inpatient Hospital Stay (HOSPITAL_COMMUNITY): Admission: RE | Admit: 2019-06-02 | Payer: Self-pay | Source: Ambulatory Visit

## 2019-06-03 ENCOUNTER — Other Ambulatory Visit (HOSPITAL_COMMUNITY): Payer: 59

## 2019-06-06 ENCOUNTER — Other Ambulatory Visit (HOSPITAL_COMMUNITY)
Admission: RE | Admit: 2019-06-06 | Discharge: 2019-06-06 | Disposition: A | Discharge status: Home / Self Care | Payer: 59 | Source: Ambulatory Visit | Attending: Cardiology | Admitting: Cardiology

## 2019-06-06 DIAGNOSIS — Z20822 Contact with and (suspected) exposure to covid-19: Secondary | ICD-10-CM | POA: Insufficient documentation

## 2019-06-06 DIAGNOSIS — Z01812 Encounter for preprocedural laboratory examination: Secondary | ICD-10-CM | POA: Insufficient documentation

## 2019-06-06 LAB — SARS CORONAVIRUS 2 (TAT 6-24 HRS): SARS Coronavirus 2: NEGATIVE

## 2019-06-07 ENCOUNTER — Ambulatory Visit (HOSPITAL_COMMUNITY): Payer: 59

## 2019-06-07 ENCOUNTER — Ambulatory Visit (HOSPITAL_COMMUNITY)
Admission: RE | Admit: 2019-06-07 | Discharge: 2019-06-07 | Disposition: A | Discharge status: Home / Self Care | Payer: 59 | Attending: Cardiology | Admitting: Cardiology

## 2019-06-07 ENCOUNTER — Encounter (HOSPITAL_COMMUNITY)
Admission: RE | Disposition: A | Discharge status: Home / Self Care | Payer: Self-pay | Source: Home / Self Care | Attending: Cardiology

## 2019-06-07 ENCOUNTER — Encounter (HOSPITAL_COMMUNITY): Payer: Self-pay | Admitting: Cardiology

## 2019-06-07 DIAGNOSIS — L985 Mucinosis of the skin: Secondary | ICD-10-CM | POA: Diagnosis not present

## 2019-06-07 DIAGNOSIS — Q211 Atrial septal defect: Secondary | ICD-10-CM | POA: Insufficient documentation

## 2019-06-07 DIAGNOSIS — I420 Dilated cardiomyopathy: Secondary | ICD-10-CM | POA: Insufficient documentation

## 2019-06-07 DIAGNOSIS — Z79899 Other long term (current) drug therapy: Secondary | ICD-10-CM | POA: Insufficient documentation

## 2019-06-07 DIAGNOSIS — I340 Nonrheumatic mitral (valve) insufficiency: Secondary | ICD-10-CM | POA: Diagnosis present

## 2019-06-07 DIAGNOSIS — D472 Monoclonal gammopathy: Secondary | ICD-10-CM | POA: Diagnosis not present

## 2019-06-07 DIAGNOSIS — Z87891 Personal history of nicotine dependence: Secondary | ICD-10-CM | POA: Diagnosis not present

## 2019-06-07 DIAGNOSIS — Z8249 Family history of ischemic heart disease and other diseases of the circulatory system: Secondary | ICD-10-CM | POA: Diagnosis not present

## 2019-06-07 DIAGNOSIS — Z7982 Long term (current) use of aspirin: Secondary | ICD-10-CM | POA: Diagnosis not present

## 2019-06-07 DIAGNOSIS — I493 Ventricular premature depolarization: Secondary | ICD-10-CM

## 2019-06-07 HISTORY — PX: TEE WITHOUT CARDIOVERSION: SHX5443

## 2019-06-07 HISTORY — PX: BUBBLE STUDY: SHX6837

## 2019-06-07 SURGERY — ECHOCARDIOGRAM, TRANSESOPHAGEAL
Anesthesia: Moderate Sedation

## 2019-06-07 MED ORDER — BUTAMBEN-TETRACAINE-BENZOCAINE 2-2-14 % EX AERO
INHALATION_SPRAY | CUTANEOUS | Status: DC | PRN
  Administered 2019-06-07: 2 via TOPICAL

## 2019-06-07 MED ORDER — METOPROLOL SUCCINATE ER 50 MG PO TB24: 50.0000 mg | ORAL_TABLET | Freq: Every day | ORAL | 3 refills | Status: DC

## 2019-06-07 MED ORDER — MIDAZOLAM HCL (PF) 10 MG/2ML IJ SOLN
INTRAMUSCULAR | Status: DC | PRN
  Administered 2019-06-07: 1 mg via INTRAVENOUS
  Administered 2019-06-07: 2 mg via INTRAVENOUS
  Administered 2019-06-07 (×3): 1 mg via INTRAVENOUS
  Administered 2019-06-07: 2 mg via INTRAVENOUS
  Administered 2019-06-07: 1 mg via INTRAVENOUS

## 2019-06-07 MED ORDER — DIPHENHYDRAMINE HCL 50 MG/ML IJ SOLN
INTRAMUSCULAR | Status: AC
  Filled 2019-06-07: qty 1

## 2019-06-07 MED ORDER — FENTANYL CITRATE (PF) 100 MCG/2ML IJ SOLN
INTRAMUSCULAR | Status: DC | PRN
  Administered 2019-06-07 (×4): 25 ug via INTRAVENOUS

## 2019-06-07 MED ORDER — FENTANYL CITRATE (PF) 100 MCG/2ML IJ SOLN
INTRAMUSCULAR | Status: AC
  Filled 2019-06-07: qty 4

## 2019-06-07 MED ORDER — MIDAZOLAM HCL (PF) 5 MG/ML IJ SOLN
INTRAMUSCULAR | Status: AC
  Filled 2019-06-07: qty 2

## 2019-06-07 NOTE — Progress Notes (Signed)
  Echocardiogram Echocardiogram Transesophageal has been performed.  Kirk Hayes 06/07/2019, 10:00 AM

## 2019-06-07 NOTE — CV Procedure (Signed)
.  TEE: Under moderate sedation, TEE was performed without complications: LV: Normal. Normal EF. RV: Normal LA: Normal. Left atrial appendage: Normal without thrombus. Tunneled PFO. RA: Normal  MV: Posterior >anterior prolapse. Severe MR. TV: Normal Trace TR AV: Normal. Mild myxomatous degeneration.No AI or AS. PV: Normal. Trace PI.  Thoracic and ascending aorta: Normal without significant plaque or atheromatous changes.  Conscious sedation protocol was followed, I personally administered conscious sedation and monitored the patient. Patient received 8 milligrams of Versed and 100 mcg fentanyl . Patient tolerated the procedure well and there was no complication from conscious sedation. Time administered was 40 min.  [name], MD [facility] [place] Pager: [phone] Office: [phone]

## 2019-06-07 NOTE — H&P (Signed)
Kirk Hayes is an 59 y.o. male.   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    Past Medical History:  Diagnosis Date  . 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 Mitral regurgitation   . Scleromyxedema    DR. BHAVE   . Shortness of breath on exertion   . Systolic murmur     Past Surgical History:  Procedure Laterality Date  . KNEE ARTHROSCOPY Right 1994  . LEFT HEART CATH AND CORONARY ANGIOGRAPHY N/A 05/11/2018   Procedure: LEFT HEART CATH AND CORONARY ANGIOGRAPHY;  Surgeon: [name], MD;  Location: [facility];  Service: Cardiovascular;  Laterality: N/A;  . TEE WITHOUT CARDIOVERSION N/A 04/13/2018   Procedure: TRANSESOPHAGEAL ECHOCARDIOGRAM (TEE);  Surgeon: [name], MD;  Location: [facility];  Service: Cardiovascular;  Laterality: N/A;    Family History  Problem Relation Age of Onset  . Heart disease Mother    Social History:  reports that he quit smoking about 26 years ago. His smoking use included cigarettes. He has a 7.50 pack-year smoking history. He has never used smokeless tobacco. He reports current alcohol use of about 3.0 standard drinks of alcohol per week. He reports that he does not use drugs.  Allergies: No Known Allergies  Review of Systems  Constitution: Negative for decreased appetite, malaise/fatigue, weight gain and weight loss.  HENT: Negative for congestion.   Eyes: Negative for visual disturbance.  Cardiovascular: Positive for palpitations. Negative for chest pain, dyspnea on exertion, leg swelling and syncope.  Respiratory: Negative for cough.   Endocrine: Negative for cold intolerance.  Hematologic/Lymphatic: Does not bruise/bleed easily.  Skin: Negative for itching and rash.  Musculoskeletal: Negative for myalgias.  Gastrointestinal:  Negative for abdominal pain, nausea and vomiting.  Genitourinary: Negative for dysuria.  Neurological: Negative for dizziness and weakness.  Psychiatric/Behavioral: The patient is not nervous/anxious.   All other systems reviewed and are negative.    Blood pressure (!) 144/79, pulse 82, temperature 97.9 F (36.6 C), temperature source Temporal, resp. rate (!) 22, height 6' (1.829 m), weight 117.9 kg, SpO2 100 %. Body mass index is 35.26 kg/m.  Physical Exam  Constitutional: He is oriented to person, place, and time. He appears well-developed and well-nourished. No distress.  HENT:  Head: Normocephalic and atraumatic.  Eyes: Pupils are equal, round, and reactive to light. Conjunctivae are normal.  Neck: No JVD present.  Cardiovascular: Normal rate, regular rhythm and intact distal pulses.  Murmur heard. High-pitched blowing holosystolic murmur is present with a grade of 3/6 at the apex. Pulmonary/Chest: Effort normal and breath sounds normal. He has no wheezes. He has no rales.  Abdominal: Soft. Bowel sounds are normal. There is no rebound.  Musculoskeletal:        General: No edema.  Lymphadenopathy:    He has no cervical adenopathy.  Neurological: He is alert and oriented to person, place, and time. No cranial nerve deficit.  Skin: Skin is warm and dry.  Psychiatric: He has a normal mood and affect.  Nursing note and vitals reviewed.   Results for orders placed or performed during the hospital encounter of 06/06/19 (from the past 48 hour(s))  SARS CORONAVIRUS 2 (TAT 6-24 HRS) Nasopharyngeal Nasopharyngeal Swab     Status: None   Collection Time: 06/06/19  9:13 AM   Specimen: Nasopharyngeal Swab  Result Value Ref  Range   SARS Coronavirus 2 NEGATIVE NEGATIVE    Comment: (NOTE) SARS-CoV-2 target nucleic acids are NOT DETECTED. The SARS-CoV-2 RNA is generally detectable in upper and lower respiratory specimens during the acute phase of infection. Negative results do not  preclude SARS-CoV-2 infection, do not rule out co-infections with other pathogens, and should not be used as the sole basis for treatment or other patient management decisions. Negative results must be combined with clinical observations, patient history, and epidemiological information. The expected result is Negative. Fact Sheet for Patients: SugarRoll.be Fact Sheet for Healthcare Providers: https://www.woods-mathews.com/ This test is not yet approved or cleared by the [place] FDA and  has been authorized for detection and/or diagnosis of SARS-CoV-2 by FDA under an Emergency Use Authorization (EUA). This EUA will remain  in effect (meaning this test can be used) for the duration of the COVID-19 declaration under Section 56 4(b)(1) of the Act, 21 U.S.C. section 360bbb-3(b)(1), unless the authorization is terminated or revoked sooner. Performed at [facility], [facility] [address] [place], [place] [address]     Labs:   Lab Results  Component Value Date   WBC 5.5 05/11/2018   HGB 16.0 05/11/2018   HCT 48.9 05/11/2018   MCV 98.2 05/11/2018   PLT 204 05/11/2018     Medications Prior to Admission  Medication Sig Dispense Refill  . acetaminophen (TYLENOL) 500 MG tablet Take 500 mg by mouth every 6 (six) hours as needed for moderate pain or headache.     . Ascorbic Acid (VITAMIN C) 1000 MG tablet Take 1,000 mg by mouth daily.    Marland Kitchen aspirin EC 81 MG tablet Take 81 mg by mouth daily.    Marland Kitchen ibuprofen (ADVIL) 200 MG tablet Take 200 mg by mouth every 8 (eight) hours as needed (for pain.).    Marland Kitchen LORazepam (ATIVAN) 1 MG tablet Take 1 mg by mouth at bedtime as needed for anxiety or sleep.     . metoprolol succinate (TOPROL XL) 25 MG 24 hr tablet Take 1 tablet (25 mg total) by mouth daily. (Patient taking differently: Take 25 mg by mouth every evening. ) 90 tablet 2  . Multiple Vitamins-Minerals (MULTIVITAMIN WITH MINERALS) tablet Take 1 tablet  by mouth daily. Multivitamin for Adults 50+    . selenium 200 MCG TABS tablet Take 200 mcg by mouth 2 (two) times a week.    . tadalafil (CIALIS) 20 MG tablet Take 20 mg by mouth daily as needed for erectile dysfunction.        No current facility-administered medications for this encounter.   Today's Vitals   06/07/19 0817  BP: (!) 144/79  Pulse: 82  Resp: (!) 22  Temp: 97.9 F (36.6 C)  TempSrc: Temporal  SpO2: 100%  Weight: 117.9 kg  Height: 6' (1.829 m)  PainSc: 0-No pain   Body mass index is 35.26 kg/m.   CARDIAC STUDIES:  Cardiovascular studies:  24-hour Holter monitor 03-15-19:  Baseline rhythm sinus. Heart rate 46 left 129 bpm. Average heart rate 71 bpm. 3.3% ventricular ectopy, mostly single PVCs, occasional trigeminy. Rare supraventricular ectopy. No other arrhythmia noted.   Cardiac MRI 10/07/2018: 1. Borderline dilated LV with EF 56%, normal wall motion. 2. Normal RV size and systolic function, EF Q000111Q. 3. No myocardial LGE, so no definitive evidence for prior MI, infiltrative disease, or myocarditis. 4. I cannot identify a definite aortic valve lesion on this study. If there is a small fibroelastoma on a moving valve, this study  likely lacks appropriate temporal resolution and TEE would be a better way to investigate. There is one view where turbulence is noted across the valve and may represent the area of abnormality. 5. Posterior mitral leaflet prolapse with highly eccentric anterior MR, looks moderate.  Cath 05/11/2018: Left dominant coronary circulation with no significant disease LVEDP mildly elevated.  TEE 04/13/2018: - Left ventricle: Systolic function was normal. The estimated ejection fraction was in the range of 55% to 60%. Wall motion was normal; there were no regional wall motion abnormalities. - Aortic valve: Trileaflet aortic valve with fimbriated echodense, mobile mass, seen attached to ventricular side of the  right coronary cusp. 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 There was no significant regurgitation. - Mitral valve: Posterior leaflet prolapse with eccentric, anteriorly directed, moderate mitral regurgitation. No flow reversal seen in pulmonary veins.  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     [name], MD 06/07/2019, 8:57 AM [facility] Pager: [phone] Office: [phone] If no answer: [phone]

## 2019-06-07 NOTE — Interval H&P Note (Signed)
History and Physical Interval Note:  06/07/2019 8:59 AM  Kirk Hayes  has presented today for surgery, with the diagnosis of AORTIC VALVE DISORDER.  The various methods of treatment have been discussed with the patient and family. After consideration of risks, benefits and other options for treatment, the patient has consented to  Procedure(s): TRANSESOPHAGEAL ECHOCARDIOGRAM (TEE) (N/A) as a surgical intervention.  The patient's history has been reviewed, patient examined, no change in status, stable for surgery.  I have reviewed the patient's chart and labs.  Questions were answered to the patient's satisfaction.     [place]

## 2019-06-07 NOTE — Discharge Instructions (Signed)

## 2019-06-08 ENCOUNTER — Encounter: Payer: Self-pay | Admitting: *Deleted

## 2019-06-20 ENCOUNTER — Ambulatory Visit: Payer: 59 | Admitting: Cardiology

## 2019-06-20 ENCOUNTER — Encounter: Payer: Self-pay | Admitting: Cardiology

## 2019-06-20 ENCOUNTER — Other Ambulatory Visit: Payer: Self-pay

## 2019-06-20 VITALS — BP 144/97 | HR 73 | Temp 97.8°F | Resp 18 | Ht 72.0 in | Wt 262.0 lb

## 2019-06-20 DIAGNOSIS — I493 Ventricular premature depolarization: Secondary | ICD-10-CM

## 2019-06-20 DIAGNOSIS — I340 Nonrheumatic mitral (valve) insufficiency: Secondary | ICD-10-CM

## 2019-06-20 MED ORDER — METOPROLOL TARTRATE 50 MG PO TABS: 50.0000 mg | ORAL_TABLET | Freq: Two times a day (BID) | ORAL | 3 refills | Status: DC

## 2019-06-20 NOTE — Progress Notes (Signed)
Patient is here for follow up visit.  Subjective:   @Patient  ID: Kirk Hayes, male    DOB: [birth year], 59 y.o.   MRN: [id number]   Chief complaint: Palpitations  HPI  59 y/o male with monoccloncal gammopathy, scleromyxedema, severe primary mitral regurgitation   Recent TEE showed his mitral regurgitation is not severe.  Aortic valve finding which was suspicious for papillary fibroblastoma seems to have not changed.  Patient is here with his wife today. He has noticed easy fatigability and reduced endurance, although there are no definite exertional shortness of breath symptoms.   Current Outpatient Medications on File Prior to Visit  Medication Sig Dispense Refill  . acetaminophen (TYLENOL) 500 MG tablet Take 500 mg by mouth every 6 (six) hours as needed for moderate pain or headache.     . Ascorbic Acid (VITAMIN C) 1000 MG tablet Take 1,000 mg by mouth daily.    Marland Kitchen aspirin EC 81 MG tablet Take 81 mg by mouth daily.    Marland Kitchen ibuprofen (ADVIL) 200 MG tablet Take 200 mg by mouth every 8 (eight) hours as needed (for pain.).    Marland Kitchen LORazepam (ATIVAN) 1 MG tablet Take 1 mg by mouth at bedtime as needed for anxiety or sleep.     . metoprolol succinate (TOPROL XL) 50 MG 24 hr tablet Take 1 tablet (50 mg total) by mouth daily. 30 tablet 3  . Multiple Vitamins-Minerals (MULTIVITAMIN WITH MINERALS) tablet Take 1 tablet by mouth daily. Multivitamin for Adults 50+    . selenium 200 MCG TABS tablet Take 200 mcg by mouth 2 (two) times a week.    . tadalafil (CIALIS) 20 MG tablet Take 20 mg by mouth daily as needed for erectile dysfunction.      No current facility-administered medications on file prior to visit.    Cardiovascular studies:  EKG 06/20/2019: Sinus rhythm 85 bpm. Left atrial enlargement. Ventricular trigeminy.  TEE 06/07/2019: 1. Left ventricular ejection fraction, by estimation, is 55 to 60%. The  left ventricle has normal function. Left ventricular endocardial border  not  optimally defined to evaluate regional wall motion.  2. Right ventricular systolic function is normal. The right ventricular  size is normal.  3. Left atrial size was At least moderately dilated. Volume likely  underestimated.. No left atrial/left atrial appendage thrombus was  detected.  4. 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 One eccentric jet is  directed anteriorly, while the otehr eccentric jet is directed  posteriorly. . The mitral valve is myxomatous. Severe mitral valve  regurgitation.  5. Myxomatous degeneration of aortic valve, especially noncoronary cusp.  No definite evidence of mobile mass. No regurgitation seen.  6. Evidence of atrial level shunting detected by color flow Doppler.  Agitated saline contrast bubble study was positive with shunting observed  within 3-6 cardiac cycles suggestive of interatrial shunt.  7. Compared to previous study on 04/13/2018, mitral regurgitation severity  increased from moderate to severe. Patent foramen ovale is new finding.   24-hour Holter monitor 02/14/2019:  Baseline rhythm sinus. Heart rate 46 left 129 bpm. Average heart rate 71 bpm. 3.3% ventricular ectopy, mostly single PVCs, occasional trigeminy. Rare supraventricular ectopy. No other arrhythmia noted.   Cardiac MRI 10/07/2018: 1.  Borderline dilated LV with EF 56%, normal wall motion. 2.  Normal RV size and systolic function, EF Q000111Q. 3. No myocardial LGE, so no definitive evidence for prior MI, infiltrative disease, or myocarditis. 4. I cannot identify a definite aortic valve  lesion on this study. If there is a small fibroelastoma on a moving valve, this study likely lacks appropriate temporal resolution and TEE would be a better way to investigate. There is one view where turbulence is noted across the valve and may represent the area of abnormality. 5. Posterior mitral leaflet prolapse with highly eccentric anterior MR, looks moderate.  Coronary  angiography 05/11/2018: Left dominant coronary circulation with no significant disease LVEDP mildly elevated.   Review of Systems  Cardiovascular: Positive for dyspnea on exertion. Negative for chest pain, leg swelling, palpitations and syncope.  Respiratory: Positive for shortness of breath.        Objective:    Vitals:   06/20/19 1456  BP: (!) 144/97  Pulse: 73  Resp: 18  Temp: 97.8 F (36.6 C)  SpO2: 100%      Physical Exam  Constitutional: He appears well-developed and well-nourished.  Neck: No JVD present.  Cardiovascular: Normal rate, regular rhythm and intact distal pulses.  Murmur heard. High-pitched blowing holosystolic murmur is present with a grade of 3/6 at the apex. Pulmonary/Chest: Effort normal and breath sounds normal. He has no wheezes. He has no rales.  Musculoskeletal:        General: No edema.  Nursing note and vitals reviewed.  Not performed. Telephone visit.       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    Severe mitral regurgitation: Stable, mildly symptomatic. (TEE 05/2019) Recommend exercise echocardiogram to establish exercise endurance, assess MR and pulmonary pressures at stress.  PVC's: Ventricular trigeminy. I suspect this is also triggered by MR.  Switch metoprolol succinate 50 mg daily to metoprolol tartrate 50 mg bid.   Aortic valve abnormality: Unchanged on two TEE's a year apart. 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   Although I have not come across any previous case reports to suggest this, It is plausible that some of this could be related mucin deposition from scleromyxedema. Mucin deposition in myocardium and coronary arteries has been documented previously. JAAD: Vol 69:1; 66-72.  F/u after stress echocardiogram.  [name], MD [facility] Pager: [phone] Office: [phone] If no answer Cell [phone]

## 2019-06-22 ENCOUNTER — Other Ambulatory Visit: Payer: Self-pay | Admitting: Cardiology

## 2019-06-22 DIAGNOSIS — I340 Nonrheumatic mitral (valve) insufficiency: Secondary | ICD-10-CM

## 2019-06-28 ENCOUNTER — Other Ambulatory Visit (HOSPITAL_COMMUNITY): Payer: Self-pay

## 2019-06-29 ENCOUNTER — Other Ambulatory Visit (HOSPITAL_COMMUNITY): Payer: 59

## 2019-06-30 ENCOUNTER — Other Ambulatory Visit (HOSPITAL_COMMUNITY): Payer: 59

## 2019-07-01 ENCOUNTER — Other Ambulatory Visit (HOSPITAL_COMMUNITY): Payer: Self-pay

## 2019-07-02 ENCOUNTER — Other Ambulatory Visit (HOSPITAL_COMMUNITY)
Admission: RE | Admit: 2019-07-02 | Discharge: 2019-07-02 | Disposition: A | Discharge status: Home / Self Care | Payer: 59 | Source: Ambulatory Visit | Attending: Cardiology | Admitting: Cardiology

## 2019-07-02 DIAGNOSIS — Z01812 Encounter for preprocedural laboratory examination: Secondary | ICD-10-CM | POA: Insufficient documentation

## 2019-07-02 DIAGNOSIS — Z20822 Contact with and (suspected) exposure to covid-19: Secondary | ICD-10-CM | POA: Insufficient documentation

## 2019-07-02 LAB — SARS CORONAVIRUS 2 (TAT 6-24 HRS): SARS Coronavirus 2: NEGATIVE

## 2019-07-05 ENCOUNTER — Other Ambulatory Visit (HOSPITAL_COMMUNITY): Payer: Self-pay

## 2019-07-05 ENCOUNTER — Ambulatory Visit: Payer: Self-pay | Admitting: Cardiology

## 2019-07-05 ENCOUNTER — Ambulatory Visit (HOSPITAL_COMMUNITY)
Admission: RE | Admit: 2019-07-05 | Discharge: 2019-07-05 | Disposition: A | Discharge status: Home / Self Care | Payer: 59 | Source: Ambulatory Visit | Attending: Cardiology | Admitting: Cardiology

## 2019-07-05 ENCOUNTER — Other Ambulatory Visit: Payer: Self-pay

## 2019-07-05 DIAGNOSIS — I340 Nonrheumatic mitral (valve) insufficiency: Secondary | ICD-10-CM | POA: Insufficient documentation

## 2019-07-05 NOTE — Progress Notes (Signed)
  Echocardiogram Echocardiogram Stress Test has been performed.  Darlina Sicilian M 07/05/2019, 2:01 PM

## 2019-07-07 ENCOUNTER — Other Ambulatory Visit (HOSPITAL_COMMUNITY): Payer: 59

## 2019-07-15 ENCOUNTER — Ambulatory Visit: Payer: 59 | Admitting: Cardiology

## 2019-07-17 NOTE — Progress Notes (Signed)
4/19 appt rescheduled to 4/20.

## 2019-07-18 ENCOUNTER — Ambulatory Visit: Payer: 59 | Admitting: Cardiology

## 2019-07-19 ENCOUNTER — Ambulatory Visit: Payer: 59 | Admitting: Cardiology

## 2019-07-19 ENCOUNTER — Other Ambulatory Visit: Payer: Self-pay

## 2019-07-19 ENCOUNTER — Encounter: Payer: Self-pay | Admitting: Cardiology

## 2019-07-19 VITALS — BP 147/87 | HR 75 | Temp 96.7°F | Ht 72.0 in | Wt 260.0 lb

## 2019-07-19 DIAGNOSIS — I340 Nonrheumatic mitral (valve) insufficiency: Secondary | ICD-10-CM

## 2019-07-19 DIAGNOSIS — I493 Ventricular premature depolarization: Secondary | ICD-10-CM

## 2019-07-19 DIAGNOSIS — I10 Essential (primary) hypertension: Secondary | ICD-10-CM | POA: Insufficient documentation

## 2019-07-19 MED ORDER — DILTIAZEM HCL ER COATED BEADS 240 MG PO CP24: 240.0000 mg | ORAL_CAPSULE | Freq: Every day | ORAL | 2 refills | Status: DC

## 2019-07-19 NOTE — Progress Notes (Signed)
Patient is here for follow up visit.  Subjective:   @Patient  ID: Kirk Hayes, male    DOB: 1960/12/25, 59 y.o.   MRN: [id number]   Chief complaint: Palpitations  HPI  59 y/o male with monoccloncal gammopathy, scleromyxedema, grade III/IV primary mitral regurgitation   TEE (05/2019) showed moderate mitral regurgitation. Stress echocardiogram (06/2019) showed excellent exercise capacity (10 METS) with no symptoms, PVC's at rest that improved with exercise, exercise induced PASP 38 mmHg.  Patient is here to discuss the stress test findings. He continues to have frequent palpitations and thinks that metoprolol is not helping his symptoms. He again asked me if he could proceed with mitral valve surgery at this time, while he is otherwise quite healthy.   Current Outpatient Medications on File Prior to Visit  Medication Sig Dispense Refill  . acetaminophen (TYLENOL) 500 MG tablet Take 500 mg by mouth every 6 (six) hours as needed for moderate pain or headache.     . Ascorbic Acid (VITAMIN C) 1000 MG tablet Take 1,000 mg by mouth daily.    Marland Kitchen aspirin EC 81 MG tablet Take 81 mg by mouth daily.    Marland Kitchen ibuprofen (ADVIL) 200 MG tablet Take 200 mg by mouth every 8 (eight) hours as needed (for pain.).    Marland Kitchen LORazepam (ATIVAN) 1 MG tablet Take 1 mg by mouth at bedtime as needed for anxiety or sleep.     . metoprolol tartrate (LOPRESSOR) 50 MG tablet Take 1 tablet (50 mg total) by mouth 2 (two) times daily. 60 tablet 3  . Multiple Vitamins-Minerals (MULTIVITAMIN WITH MINERALS) tablet Take 1 tablet by mouth daily. Multivitamin for Adults 50+    . selenium 200 MCG TABS tablet Take 200 mcg by mouth 2 (two) times a week.    . tadalafil (CIALIS) 20 MG tablet Take 20 mg by mouth daily as needed for erectile dysfunction.      No current facility-administered medications on file prior to visit.    Cardiovascular studies:  Stress echocardiogram 07/05/2019: 1. This is a negative stress echocardiogram  for ischemia.  2. This is a low risk study.   FINDINGS   Exam Protocol: The patient exercised on a treadmill according to a Bruce  protocol.     Patient Performance: The patient exercised for 8 minutes and 55 seconds,  achieving 10 METS. The maximum stage achieved was III of the Bruce  protocol. The heart rate at peak stress was 172 bpm. The target heart rate  was calculated to be 138 bpm. The  percentage of maximum predicted heart rate achieved was 105.7 %. The  baseline blood pressure was 121/97 mmHg. The blood pressure at peak stress  was 201/99 mmHg. The patient developed no symptoms during the stress exam.  Excellent exercise tolerance.    EKG: Resting EKG showed normal sinus rhythm at a rate of 94 beats per  minute with frequent premature ventricular contractions. The patient  developed no abnormal EKG findings during exercise. The arrhythmia  resolved spontaneously in recovery.     2D Echo Findings: Baseline regional wall motion abnormalities were not  present. There were no stress-induced wall motion abnormalities. This is a  negative stress echocardiogram for ischemia.    Pre-exercise: Trace TR. Inadequate TR jet to calculate PASP  Post-exercise: Mild to moderate TR. Estimated PASP 38 mmHg   EKG 06/20/2019: Sinus rhythm 85 bpm. Left atrial enlargement. Ventricular trigeminy.  TEE 06/07/2019: 1. Left ventricular ejection fraction, by estimation, is 55 to  60%. The  left ventricle has normal function. Left ventricular endocardial border  not optimally defined to evaluate regional wall motion.  2. Right ventricular systolic function is normal. The right ventricular  size is normal.  3. Left atrial size was At least moderately dilated. Volume likely  underestimated.. No left atrial/left atrial appendage thrombus was  detected.  4. 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 One eccentric jet is  directed anteriorly, while the otehr eccentric jet is directed   posteriorly. . The mitral valve is myxomatous. Severe mitral valve  regurgitation.  5. Myxomatous degeneration of aortic valve, especially noncoronary cusp.  No definite evidence of mobile mass. No regurgitation seen.  6. Evidence of atrial level shunting detected by color flow Doppler.  Agitated saline contrast bubble study was positive with shunting observed  within 3-6 cardiac cycles suggestive of interatrial shunt.  7. Compared to previous study on 04/13/2018, mitral regurgitation severity  increased from moderate to severe. Patent foramen ovale is new finding.   24-hour Holter monitor 02/14/2019:  Baseline rhythm sinus. Heart rate 46 left 129 bpm. Average heart rate 71 bpm. 3.3% ventricular ectopy, mostly single PVCs, occasional trigeminy. Rare supraventricular ectopy. No other arrhythmia noted.   Cardiac MRI 10/07/2018: 1.  Borderline dilated LV with EF 56%, normal wall motion. 2.  Normal RV size and systolic function, EF Q000111Q. 3. No myocardial LGE, so no definitive evidence for prior MI, infiltrative disease, or myocarditis. 4. I cannot identify a definite aortic valve lesion on this study. If there is a small fibroelastoma on a moving valve, this study likely lacks appropriate temporal resolution and TEE would be a better way to investigate. There is one view where turbulence is noted across the valve and may represent the area of abnormality. 5. Posterior mitral leaflet prolapse with highly eccentric anterior MR, looks moderate.  Coronary angiography 05/11/2018: Left dominant coronary circulation with no significant disease LVEDP mildly elevated.   Review of Systems  Cardiovascular: Positive for dyspnea on exertion. Negative for chest pain, leg swelling, palpitations and syncope.  Respiratory: Positive for shortness of breath.        Objective:    Vitals:   07/19/19 0955  BP: (!) 147/87  Pulse: 75  Temp: (!) 96.7 F (35.9 C)  SpO2: 98%      Physical  Exam  Constitutional: He appears well-developed and well-nourished.  Neck: No JVD present.  Cardiovascular: Normal rate, regular rhythm and intact distal pulses.  Murmur heard. High-pitched blowing holosystolic murmur is present with a grade of 3/6 at the apex. Pulmonary/Chest: Effort normal and breath sounds normal. He has no wheezes. He has no rales.  Musculoskeletal:        General: No edema.  Nursing note and vitals reviewed.  Not performed. Telephone visit.       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 He again asks me if there could be any benefit in performing mitral valve repair early while he is otherwise healthy. I explained to him the natural history of mitral regurgitation. Early repair does reduce morbidity and mortality for severe repair. He is relatively asymptomatic, but had mildly elevated PASP on exercise. I will refer him back to Dr. Roxy Manns for further discussion regarding timing of mitral valve repair surgery.  Aortic valve abnormality: Unchanged on two TEE's a year apart. 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   Symptomatic PVC's: Switch metoprolol tartarate 50 mg bid to diltiazem 240 mg daily. I suspect this is also triggered by MR and do not think ablation is indicated.  F/u w/me in 3 months.  [name], MD [facility] Pager: [phone] Office: [phone] If no answer Cell [phone]

## 2019-07-25 ENCOUNTER — Ambulatory Visit: Payer: 59 | Admitting: Thoracic Surgery (Cardiothoracic Vascular Surgery)

## 2019-07-28 ENCOUNTER — Encounter: Payer: Self-pay | Admitting: Thoracic Surgery (Cardiothoracic Vascular Surgery)

## 2019-07-28 ENCOUNTER — Other Ambulatory Visit: Payer: Self-pay

## 2019-07-28 ENCOUNTER — Ambulatory Visit (INDEPENDENT_AMBULATORY_CARE_PROVIDER_SITE_OTHER): Payer: 59 | Admitting: Thoracic Surgery (Cardiothoracic Vascular Surgery)

## 2019-07-28 VITALS — BP 150/95 | HR 73 | Temp 97.8°F | Resp 20 | Ht 72.0 in | Wt 258.0 lb

## 2019-07-28 DIAGNOSIS — D472 Monoclonal gammopathy: Secondary | ICD-10-CM

## 2019-07-28 DIAGNOSIS — I340 Nonrheumatic mitral (valve) insufficiency: Secondary | ICD-10-CM

## 2019-07-28 DIAGNOSIS — I359 Nonrheumatic aortic valve disorder, unspecified: Secondary | ICD-10-CM | POA: Diagnosis not present

## 2019-07-28 NOTE — Progress Notes (Signed)
[address]       [place] [address]             [phone]     CARDIOTHORACIC SURGERY CONSULTATION REPORT  Referring Provider is [name], [name], MD PCP is [name], MD  Chief Complaint  Patient presents with  . Mitral Regurgitation    further discuss surgery   . 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  Patient was initially seen in consultation on May 10, 2018.  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  Patient was asymptomatic at that time.  We discussed the risks and benefits of elective mitral valve repair with surgical excision of the aortic valve mass for definitive diagnosis.  At the time he was also suffering from increased symptoms related to his underlying scleromyxedema for which she was returning to his hematologist at [facility] in [place] to resume treatment.  Shortly after that the COVID-19 pandemic took off and the patient elected to hold off on any possible plans for elective cardiac surgery.    Over the past year the patient has been followed carefully by Dr. Virgina Jock.  He is followed by Dr. Joan Mayans in [place] and receiving IVIG infusions for his scleromyxedema.  He apparently has done well with this.  He has remained asymptomatic although he apparently has developed increased PVCs.  He was seen in follow-up recently by Dr. Virgina Jock and repeat TEE  revealed what was felt to be slight worsening of the patient's mitral regurgitation.  The aortic valve mass appeared stable.  Stress echocardiography was performed and the patient had normal exercise tolerance although he did develop signs of mild pulmonary hypertension with exercise.  Follow-up cardiothoracic surgical consultation was requested.  Patient denies any symptoms of exertional shortness of breath or chest discomfort.  He reports no change in his exercise tolerance although he admits that he does not do any sort of strenuous exercise on a regular basis.  He denies any chest pain or chest tightness.  Past Medical History:  Diagnosis Date  . 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 IgG monoclonal gammopathy   . Mitral regurgitation   . Scleromyxedema    DR. BHAVE   . Shortness of breath on exertion   . Systolic murmur     Past Surgical History:  Procedure Laterality Date  . BUBBLE STUDY  06/07/2019   Procedure: BUBBLE STUDY;  Surgeon: [name], MD;  Location: [facility];  Service: Cardiovascular;;  . KNEE ARTHROSCOPY Right 1994  . LEFT HEART CATH AND CORONARY ANGIOGRAPHY N/A 05/11/2018   Procedure: LEFT HEART CATH AND CORONARY ANGIOGRAPHY;  Surgeon: [name], MD;  Location: [facility];  Service: Cardiovascular;  Laterality: N/A;  . TEE WITHOUT CARDIOVERSION N/A 04/13/2018   Procedure: TRANSESOPHAGEAL ECHOCARDIOGRAM (TEE);  Surgeon: [name], MD;  Location: [facility];  Service: Cardiovascular;  Laterality: N/A;  .  TEE WITHOUT CARDIOVERSION N/A 06/07/2019   Procedure: TRANSESOPHAGEAL ECHOCARDIOGRAM (TEE);  Surgeon: [name], MD;  Location: [facility];  Service: Cardiovascular;  Laterality: N/A;    Family History  Problem Relation Age of Onset  . Heart disease Mother   . Kidney failure Sister     Social History   Socioeconomic History  . Marital status: Married     Spouse name: Not on file  . Number of children: 3  . Years of education: Not on file  . Highest education level: Not on file  Occupational History  . Not on file  Tobacco Use  . Smoking status: Former Smoker    Packs/day: 0.50    Years: 15.00    Pack years: 7.50    Types: Cigarettes    Quit date: 05/10/1993    Years since quitting: 26.2  . Smokeless tobacco: Never Used  Substance and Sexual Activity  . Alcohol use: Yes    Alcohol/week: 3.0 standard drinks    Types: 3 Cans of beer per week    Comment: 3-4 PER DAY  . Drug use: Never  . Sexual activity: Not on file  Other Topics Concern  . Not on file  Social History Narrative  . Not on file   Social Determinants of Health   Financial Resource Strain:   . Difficulty of Paying Living Expenses:   Food Insecurity:   . Worried About Charity fundraiser in the Last Year:   . Arboriculturist in the Last Year:   Transportation Needs:   . Film/video editor (Medical):   Marland Kitchen Lack of Transportation (Non-Medical):   Physical Activity:   . Days of Exercise per Week:   . Minutes of Exercise per Session:   Stress:   . Feeling of Stress :   Social Connections:   . Frequency of Communication with Friends and Family:   . Frequency of Social Gatherings with Friends and Family:   . Attends Religious Services:   . Active Member of Clubs or Organizations:   . Attends Archivist Meetings:   Marland Kitchen Marital Status:   Intimate Partner Violence:   . Fear of Current or Ex-Partner:   . Emotionally Abused:   Marland Kitchen Physically Abused:   . Sexually Abused:     Current Outpatient Medications  Medication Sig Dispense Refill  . acetaminophen (TYLENOL) 500 MG tablet Take 500 mg by mouth every 6 (six) hours as needed for moderate pain or headache.     . Ascorbic Acid (VITAMIN C) 1000 MG tablet Take 1,000 mg by mouth daily.    Marland Kitchen aspirin EC 81 MG tablet Take 81 mg by mouth daily.    Marland Kitchen ibuprofen (ADVIL) 200 MG tablet Take 200 mg by mouth every 8  (eight) hours as needed (for pain.).    Marland Kitchen LORazepam (ATIVAN) 1 MG tablet Take 1 mg by mouth at bedtime as needed for anxiety or sleep.     . Multiple Vitamins-Minerals (MULTIVITAMIN WITH MINERALS) tablet Take 1 tablet by mouth daily. Multivitamin for Adults 50+    . selenium 200 MCG TABS tablet Take 200 mcg by mouth 2 (two) times a week.    . tadalafil (CIALIS) 20 MG tablet Take 20 mg by mouth daily as needed for erectile dysfunction.     Marland Kitchen diltiazem (CARDIZEM CD) 240 MG 24 hr capsule Take 1 capsule (240 mg total) by mouth daily. (Patient not taking: Reported on 07/28/2019) 30 capsule 2   No current  facility-administered medications for this visit.    No Known Allergies    Review of Systems:              General:                      normal appetite, decreased energy, no weight gain, no weight loss, no fever             Cardiac:                       no chest pain with exertion, no chest pain at rest, +SOB with more strenuous exertion, no resting SOB, no PND, no orthopnea, no palpitations, no arrhythmia, no atrial fibrillation, no LE edema, no dizzy spells, no syncope             Respiratory:                 no shortness of breath, no home oxygen, no productive cough, no dry cough, no bronchitis, no wheezing, no hemoptysis, no asthma, no pain with inspiration or cough, no sleep apnea, no CPAP at night             GI:                               no difficulty swallowing, no reflux, no frequent heartburn, no hiatal hernia, no abdominal pain, no constipation, no diarrhea, no hematochezia, no hematemesis, no melena             GU:                              no dysuria,  no frequency, no urinary tract infection, no hematuria, no enlarged prostate, no kidney stones, no kidney disease             Vascular:                     no pain suggestive of claudication, no pain in feet, occasional leg cramps, no varicose veins, no DVT, no non-healing foot ulcer             Neuro:                         no  stroke, no TIA's, no seizures, no headaches, + occasional occular migraines, no temporary blindness one eye,  no slurred speech, + peripheral neuropathy, no chronic pain, no instability of gait, no memory/cognitive dysfunction             Musculoskeletal:         + arthritis, no joint swelling, no myalgias, no difficulty walking, no mobility              Skin:                            no rash, no itching, no skin infections, no pressure sores or ulcerations             Psych:                         no anxiety, + depression, no nervousness, no unusual recent stress             Eyes:  no blurry vision, + floaters, no recent vision changes, + wears glasses or contacts             ENT:                            no hearing loss, no loose or painful teeth, no dentures             Hematologic:               no easy bruising, no abnormal bleeding, no clotting disorder, no frequent epistaxis             Endocrine:                   no diabetes, does not check CBG's at home     Physical Exam:   BP (!) 150/95   Pulse 73   Temp 97.8 F (36.6 C) (Skin)   Resp 20   Ht 6' (1.829 m)   Wt 258 lb (117 kg)   SpO2 96% Comment: RA  BMI 34.99 kg/m   General:    well-appearing  HEENT:  Unremarkable   Neck:   no JVD, no bruits, no adenopathy   Chest:   clear to auscultation, symmetrical breath sounds, no wheezes, no rhonchi   CV:   RRR, grade II/VI blowing holosystolic murmur   Abdomen:  soft, non-tender, no masses   Extremities:  warm, well-perfused, pulses diminished but palpable, no LE edema  Rectal/GU  Deferred  Neuro:   Grossly non-focal and symmetrical throughout  Skin:   Clean and dry, no rashes, no breakdown   Diagnostic Tests:  TRANSESOPHOGEAL ECHO REPORT       Patient Name:  Kirk Hayes Date of Exam: 06/07/2019  Medical Rec #: [id number]    Height:    72.0 in  Accession #:  [id number]   Weight:    260.0 lb  Date of Birth: Aug 18, 1960    BSA:     2.382 m  Patient Age:  55 years    BP:      158/60 mmHg  Patient Gender: M        HR:      67 bpm.  Exam Location: Inpatient   Procedure: Cardiac Doppler, Color Doppler, Limited Echo, 3D Echo,       Transesophageal Echo and Saline Contrast Bubble Study   Indications:   I34.0 Nonrheumatic mitral (valve) insufficiency    History:     Patient has prior history of Echocardiogram examinations,  most          recent 04/13/2018. Abnormal ECG; Aortic Valve Disease,  Mitral          Valve Disease and Mitral Valve Prolapse. Mitral  regurgitation.          Sclermyxedema.    Sonographer:   Roseanna Rainbow RDCS  Referring Phys: [id number] [facility] J [name]  Diagnosing Phys: Vernell Leep MD     Sonographer Comments: Technically difficult study due to poor echo  windows. Additional transthoracic images obtained post TEE.    PROCEDURE: The transesophogeal probe was passed without difficulty through  the esophogus of the patient. Imaged were obtained with the patient in a  left lateral decubitus position. Local oropharyngeal anesthetic was  provided with Cetacaine. Sedation  performed by performing physician. Patients was under conscious sedation  during this procedure. Anesthetic administered: 120mcg of Fentanyl, 8.0mg   of Versed. The patient's vital signs; including heart  rate, blood  pressure, and oxygen saturation; remained  stable throughout the procedure. The patient developed no complications  during the procedure.   IMPRESSIONS    1. Left ventricular ejection fraction, by estimation, is 55 to 60%. The  left ventricle has normal function. Left ventricular endocardial border  not optimally defined to evaluate regional wall motion.  2. Right ventricular systolic function is normal. The right ventricular  size is normal.  3. Left atrial size was At least moderately dilated. Volume likely    underestimated.. No left atrial/left atrial appendage thrombus was  detected.  4. 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 One eccentric jet is  directed anteriorly, while the otehr eccentric jet is directed  posteriorly. . The mitral valve is myxomatous. Severe mitral valve  regurgitation.  5. Myxomatous degeneration of aortic valve, especially noncoronary cusp.  No definite evidence of mobile mass. No regurgitation seen.  6. Evidence of atrial level shunting detected by color flow Doppler.  Agitated saline contrast bubble study was positive with shunting observed  within 3-6 cardiac cycles suggestive of interatrial shunt.  7. Compared to previous study on 04/13/2018, mitral regurgitation severity  increased from moderate to severe. Patent foramen ovale is new finding.   FINDINGS  Left Ventricle: Left ventricular ejection fraction, by estimation, is 55  to 60%. The left ventricle has normal function. Left ventricular  endocardial border not optimally defined to evaluate regional wall motion.  The left ventricular internal cavity  size was normal in size. There is no left ventricular hypertrophy.   Right Ventricle: The right ventricular size is normal. No increase in  right ventricular wall thickness. Right ventricular systolic function is  normal.   Left Atrium: Left atrial size was At least moderately dilated. Volume  likely underestimated. No left atrial/left atrial appendage thrombus was  detected.   Right Atrium: Right atrial size was normal in size.   Pericardium: There is no evidence of pericardial effusion.   Mitral Valve: A2-P2 and P1 prolapse with two separate jets. One eccentric  jet is directed anteriorly, while the otehr eccentric jet is directed  posteriorly. The mitral valve is myxomatous. There is moderate prolapse of  of the mitral valve. Severe mitral  valve regurgitation.   Tricuspid Valve: The tricuspid valve is grossly normal. Tricuspid valve   regurgitation is mild.   Aortic Valve: Myxomatous degeneration of aortic valve, especially  noncoronary cusp. No definite evidence of mobile mass. The aortic valve is  abnormal. Aortic valve regurgitation is not visualized.   Pulmonic Valve: The pulmonic valve was grossly normal. Pulmonic valve  regurgitation is not visualized.   Aorta: The aortic root and ascending aorta are structurally normal, with  no evidence of dilitation.   IAS/Shunts: The interatrial septum is aneurysmal. There is redundancy of  the interatrial septum. Evidence of atrial level shunting detected by  color flow Doppler. Agitated saline contrast was given intravenously to  evaluate for intracardiac shunting.  Agitated saline contrast bubble study was positive with shunting observed  within 3-6 cardiac cycles suggestive of interatrial shunt.       LV Volumes (MOD)  LV vol d, MOD A2C: 96.1 ml  LV vol d, MOD A4C: 175.0 ml  LV vol s, MOD A2C: 52.6 ml  LV vol s, MOD A4C: 74.8 ml  LV SV MOD A2C:   43.5 ml  LV SV MOD A4C:   175.0 ml  LV SV MOD BP:   71.5 ml   LEFT ATRIUM  Index  LA Vol (A2C):  61.7 ml 25.90 ml/m  LA Vol (A4C):  42.4 ml 17.80 ml/m  LA Biplane Vol: 53.8 ml 22.59 ml/m  MR Peak grad:  135.0 mmHg  MR Mean grad:  66.0 mmHg  MR Vmax:     581.00 cm/s  MR Vmean:    358.0 cm/s  MR PISA:     2.65 cm  MR PISA Eff ROA: 17 mm  MR PISA Radius: 0.65 cm   Manish [name] MD  Electronically signed by Vernell Leep MD  Signature Date/Time: 06/07/2019/2:55:18 PM       EXERCISE STRESS ECHO REPORT      ---------------------------------------------------------------------------  -----   Patient Name:  Kirk Hayes Date of Exam: 07/05/2019  Medical Rec #: [id number]    Height:    72.0 in  Accession #:  [id number]   Weight:    262.0 lb  Date of Birth: 12-04-1960   BSA:     2.390 m  Patient Age:  68 years    BP:       121/97 mmHg  Patient Gender: M        HR:      94 bpm.  Exam Location: Outpatient   Procedure: Stress Echo, Color Doppler and Cardiac Doppler   Indications:   I34.0 Nonrheumatic mitral (valve) insufficiency    History:     Patient has prior history of Echocardiogram examinations,  most          recent 08/05/2019. Aortic valve mass. Scleromyxedema.    Sonographer:   Darlina Sicilian RDCS  Referring Phys: [id number] [facility] J [name]  Diagnosing Phys: Vernell Leep MD   IMPRESSIONS    1. This is a negative stress echocardiogram for ischemia.  2. This is a low risk study.   FINDINGS   Exam Protocol: The patient exercised on a treadmill according to a Bruce  protocol.     Patient Performance: The patient exercised for 8 minutes and 55 seconds,  achieving 10 METS. The maximum stage achieved was III of the Bruce  protocol. The heart rate at peak stress was 172 bpm. The target heart rate  was calculated to be 138 bpm. The  percentage of maximum predicted heart rate achieved was 105.7 %. The  baseline blood pressure was 121/97 mmHg. The blood pressure at peak stress  was 201/99 mmHg. The patient developed no symptoms during the stress exam.  Excellent exercise tolerance.    EKG: Resting EKG showed normal sinus rhythm at a rate of 94 beats per  minute with frequent premature ventricular contractions. The patient  developed no abnormal EKG findings during exercise. The arrhythmia  resolved spontaneously in recovery.     2D Echo Findings: Baseline regional wall motion abnormalities were not  present. There were no stress-induced wall motion abnormalities. This is a  negative stress echocardiogram for ischemia.    Pre-exercise: Trace TR. Inadequate TR jet to calculate PASP  Post-exercise: Mild to moderate TR. Estimated PASP 38 mmHg   Vernell Leep MD  Electronically signed on 07/05/2019 at 2:30:08 PM      Impression:  Patient  has at least stage B and probably stage C mitral valve prolapse with severe asymptomatic primary mitral regurgitation.  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  I have personally reviewed the patient's recent follow-up transesophageal echocardiogram.  The patient has  myxomatous degenerative disease of the mitral valve with a combination of type I and type II dysfunction.  There is at least moderate-severe and likely severe mitral regurgitation related to prolapse involving a portion of the posterior leaflet.  Left ventricular size and systolic function remain normal.  Based upon review of the patient's TEE mitral valve repair appears feasible with very high likelihood of success and expectations for durable long-term result.  TEE also reveals a fairly mobile mass measuring greater than 1 cm in diameter adherent to the ventricular surface of the right coronary leaflet of the aortic valve that has remained stable over the past year.  This could be unusual myxoid deposits or possibly a papillary fibro-elastoma.  Because of the mobile characteristics of this mass the patient is likely at some risk for embolization.  I feel that it would make sense to proceed with surgical resection of the aortic valve mass and mitral valve repair.   Plan:  The patient was again counseled at length regarding the indications, risks and potential benefits of resection of aortic valve mass and mitral valve repair.  The rationale for elective surgery has been explained, including a comparison between surgery and continued medical therapy with close follow-up.  Concerns about the mobile nature of the aortic valve mass and associated risk of embolization were discussed.  The likelihood of successful and durable mitral valve repair has been discussed with  particular reference to the findings of their recent echocardiogram.  Based upon these findings and previous experience, I have quoted them a greater than 95 percent likelihood of successful mitral valve repair.  The need for definitive surgical resection of the aortic valve mass was discussed including the possibility that aortic valve repair or replacement could be necessary.  Alternative surgical approaches have been discussed including a comparison between conventional sternotomy and minimally-invasive techniques.  The relative risks and benefits of each have been reviewed as they pertain to the patient's specific circumstances, and all of their questions have been addressed.  Expectations for the patient's postoperative convalescence following uncomplicated surgery were discussed.    All questions answered.  Patient wants to think matters over further before making any decisions.  He will continue to follow-up regularly with Dr. Virgina Jock and call to return to our office and schedule surgery in the future as desired.    I spent in excess of 60 minutes during the conduct of this office consultation and >50% of this time involved direct face-to-face encounter with the patient for counseling and/or coordination of their care.    Valentina Gu. Roxy Manns, MD 07/28/2019 5:47 PM

## 2019-07-28 NOTE — Patient Instructions (Signed)
Continue all previous medications without any changes at this time  

## 2019-08-01 ENCOUNTER — Other Ambulatory Visit: Payer: 59

## 2019-08-02 ENCOUNTER — Telehealth: Payer: Self-pay

## 2019-08-02 NOTE — Telephone Encounter (Signed)
Keep the appt with Dr. Virgina Jock to review the plan of care regarding his valve disease and recent appt with Dr. Ricard Dillon.

## 2019-08-05 ENCOUNTER — Other Ambulatory Visit: Payer: 59

## 2019-08-05 NOTE — Progress Notes (Signed)
Patient is here for follow up visit.  Subjective:   '@Patient'  ID: Kirk Hayes, male    DOB: 06/16/1960, 59 y.o.   MRN: [id number]   Chief complaint: Palpitations  HPI   58 y/o male with monoccloncal gammopathy, scleromyxedema, grade III/IV primary mitral regurgitation, aortic valve abnormality.  Patient underwent stress echocardiogram that showed excellent exercise capacity (10 METS), III/IV MR and mildly elevated pulmonary artery systolic pressure 39 mmHg. Aortic valve shows mobile appearance.  Mother appearance has stayed unchanged on 2 teas 1 year apart, concern remains if this is only myxomatous changes, or whether he truly represents papillary fibroblastoma.  Definitive diagnosis would only occur under microscopy.  Patient was interested in pursuing early mitral valve repair.  He saw Dr. Roxy Manns for consultation visit.  Dr. Roxy Manns feels that the aortic valve mass should be addressed at the same time as mitral valve repair.  In the meantime, patient has been doing well without any overt symptoms of exertional dyspnea.  He has not experience any symptoms of palpitations.  He was started on diltiazem 240 mg.  He had sudden shaking symptoms after starting diltiazem.  He had a admits that this could also have been related to tequila consumption on the same day.  That said, has been doing fairly well without it has not.    Current Outpatient Medications on File Prior to Visit  Medication Sig Dispense Refill  . acetaminophen (TYLENOL) 500 MG tablet Take 500 mg by mouth every 6 (six) hours as needed for moderate pain or headache.     . Ascorbic Acid (VITAMIN C) 1000 MG tablet Take 1,000 mg by mouth daily.    Marland Kitchen aspirin EC 81 MG tablet Take 81 mg by mouth daily.    Marland Kitchen ibuprofen (ADVIL) 200 MG tablet Take 200 mg by mouth every 8 (eight) hours as needed (for pain.).    Marland Kitchen LORazepam (ATIVAN) 1 MG tablet Take 1 mg by mouth at bedtime as needed for anxiety or sleep.     . Multiple  Vitamins-Minerals (MULTIVITAMIN WITH MINERALS) tablet Take 1 tablet by mouth daily. Multivitamin for Adults 50+    . selenium 200 MCG TABS tablet Take 200 mcg by mouth 2 (two) times a week.    . tadalafil (CIALIS) 20 MG tablet Take 20 mg by mouth daily as needed for erectile dysfunction.     Marland Kitchen diltiazem (CARDIZEM CD) 240 MG 24 hr capsule Take 1 capsule (240 mg total) by mouth daily. (Patient not taking: Reported on 08/11/2019) 30 capsule 2   No current facility-administered medications on file prior to visit.    Cardiovascular studies:  EKG 08/11/2019: Sinus rhythm 76 bpm. Ventricular trigeminy.  Stress echocardiogram 07/05/2019: 1. This is a negative stress echocardiogram for ischemia.  2. This is a low risk study.   FINDINGS  Exam Protocol: The patient exercised on a treadmill according to a Bruce  protocol.    Patient Performance: The patient exercised for 8 minutes and 55 seconds,  achieving 10 METS. The maximum stage achieved was III of the Bruce  protocol. The heart rate at peak stress was 172 bpm. The target heart rate  was calculated to be 138 bpm. The  percentage of maximum predicted heart rate achieved was 105.7 %. The  baseline blood pressure was 121/97 mmHg. The blood pressure at peak stress  was 201/99 mmHg. The patient developed no symptoms during the stress exam.  Excellent exercise tolerance.    EKG: Resting EKG showed normal  sinus rhythm at a rate of 94 beats per  minute with frequent premature ventricular contractions. The patient  developed no abnormal EKG findings during exercise. The arrhythmia  resolved spontaneously in recovery.     2D Echo Findings: Baseline regional wall motion abnormalities were not  present. There were no stress-induced wall motion abnormalities. This is a  negative stress echocardiogram for ischemia.    Pre-exercise: Trace TR. Inadequate TR jet to calculate PASP  Post-exercise: Mild to moderate TR. Estimated PASP 38 mmHg   EKG  06/20/2019: Sinus rhythm 85 bpm. Left atrial enlargement. Ventricular trigeminy.  TEE 06/07/2019: 1. Left ventricular ejection fraction, by estimation, is 55 to 60%. The left ventricle has normal function. Left ventricular endocardial border not optimally defined to evaluate regional wall motion.  2. Right ventricular systolic function is normal. The right ventricular size is normal.  3. Left atrial size was At least moderately dilated. Volume likely underestimated.. No left atrial/left atrial appendage thrombus was detected.  4. 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 One eccentric jet is directed anteriorly, while the otehr eccentric jet is directed posteriorly. The mitral valve is myxomatous. Severe mitral valve regurgitation.  5. Myxomatous degeneration of aortic valve, especially noncoronary cusp.  No definite evidence of mobile mass. No regurgitation seen.  6. Evidence of atrial level shunting detected by color flow Doppler.  Agitated saline contrast bubble study was positive with shunting observed within 3-6 cardiac cycles suggestive of interatrial shunt.  7. Compared to previous study on 04/13/2018, mitral regurgitation severity increased from moderate to severe. Patent foramen ovale is new finding.   24-hour Holter monitor 02/14/2019:  Baseline rhythm sinus. Heart rate 46 left 129 bpm. Average heart rate 71 bpm. 3.3% ventricular ectopy, mostly single PVCs, occasional trigeminy.  Rare supraventricular ectopy. No other arrhythmia noted.   Cardiac MRI 10/07/2018: 1.  Borderline dilated LV with EF 56%, normal wall motion. 2.  Normal RV size and systolic function, EF 32%. 3. No myocardial LGE, so no definitive evidence for prior MI, infiltrative disease, or myocarditis. 4. I cannot identify a definite aortic valve lesion on this study. If there is a small fibroelastoma on a moving valve, this study likely lacks appropriate temporal resolution and TEE would be a better way to  investigate.  There is one view where turbulence isnoted across the valve and may represent the area of abnormality. 5. Posterior mitral leaflet prolapse with highly eccentric anterior MR, looks moderate.  Coronary angiography 05/11/2018: Left dominant coronary circulation with no significant disease LVEDP mildly elevated.  Recent Labs: 10/07/2018:  Creatinine 1.07. EGFR 60.   05/11/2018: Glucose 97, BUN/Cr 15/0.97.  Na/K 143/3.8. Rest of the CMP normal H/H 16/48.9. MCV 98.2. Platelets 204. RBC 4.98. WBC 5.5.   Review of Systems  Cardiovascular: Negative for chest pain, dyspnea on exertion, leg swelling, palpitations and syncope.        Objective:    Vitals:   08/11/19 1012  BP: 135/70  Pulse: (!) 54  Resp: 15  Temp: 97.8 F (36.6 C)  SpO2: 100%    Physical Exam  Constitutional: No distress.  Neck: No JVD present.  Cardiovascular: Normal rate, regular rhythm and intact distal pulses.  Murmur heard. High-pitched blowing holosystolic murmur is present with a grade of 4/6 at the apex. Pulmonary/Chest: Effort normal and breath sounds normal. He has no wheezes. He has no rales.  Musculoskeletal:        General: No edema.  Nursing note and vitals reviewed.  Assessment & Recommendations:    59 y/o male with monoccloncal gammopathy, scleromyxedema, grade III/IV primary mitral regurgitation, aortic valve abnormality.  Primary mitral regurgitation, aortic valve abnormality: Grade III/IV with mild exercise induced pulmonary hypertension (PASP 38 mmHg). Unchanged on two TEE's a year apart.  While it is less likely to be papillary fibroelastoma, more likely to represent myxomatous aortic valve, definitive diagnosis could only be made postoperatively. Dr. Roxy Manns feels that the aortic valve mass should be addressed at the same time as mitral valve repair.  I am in agreement.  Patient will follow up with Dr. Alfonse Spruce regarding timing of the surgery.  Frequent  PVC's: Currently asymptomatic.  He has not tolerated metoprolol or diltiazem. Reasonable to follow watchful observation at this time.    F/u w/me in 3 months.  [name], MD [facility] Pager: [phone] Office: [phone] If no answer Cell [phone]

## 2019-08-11 ENCOUNTER — Other Ambulatory Visit: Payer: Self-pay

## 2019-08-11 ENCOUNTER — Ambulatory Visit: Payer: 59 | Admitting: Cardiology

## 2019-08-11 ENCOUNTER — Encounter: Payer: Self-pay | Admitting: Cardiology

## 2019-08-11 VITALS — BP 135/70 | HR 54 | Temp 97.8°F | Resp 15 | Ht 72.0 in | Wt 253.0 lb

## 2019-08-11 DIAGNOSIS — I359 Nonrheumatic aortic valve disorder, unspecified: Secondary | ICD-10-CM

## 2019-08-11 DIAGNOSIS — I340 Nonrheumatic mitral (valve) insufficiency: Secondary | ICD-10-CM

## 2019-08-11 DIAGNOSIS — I493 Ventricular premature depolarization: Secondary | ICD-10-CM

## 2019-08-11 DIAGNOSIS — I10 Essential (primary) hypertension: Secondary | ICD-10-CM

## 2019-08-11 DIAGNOSIS — L985 Mucinosis of the skin: Secondary | ICD-10-CM

## 2019-08-11 IMAGING — CR DG TOE 4TH 2+V*L*
6 series · 6 of 6 positions shown · non-contrast
Comparison: None.

CLINICAL DATA: Stubbed toe 2 weeks ago with persistent pain,
initial encounter

EXAM:
LEFT FOURTH TOE

[x toes ap left (1 of 2)]
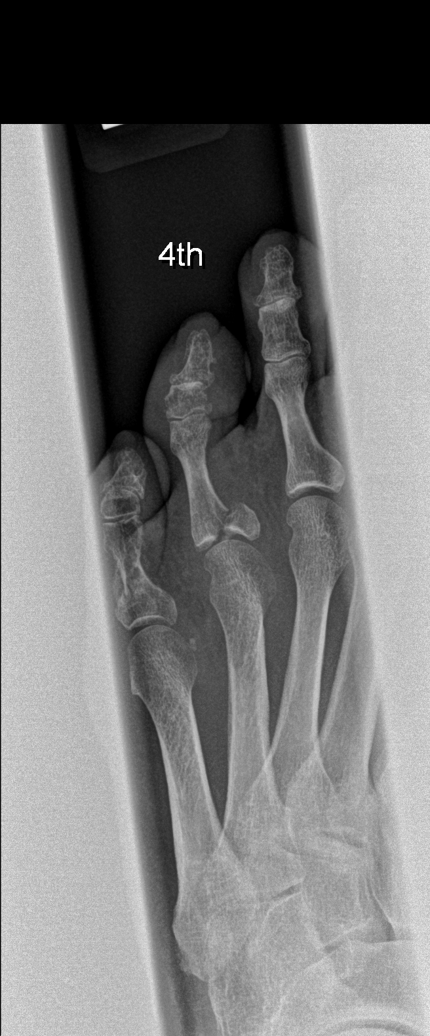

[x toes obl left (1 of 2)]
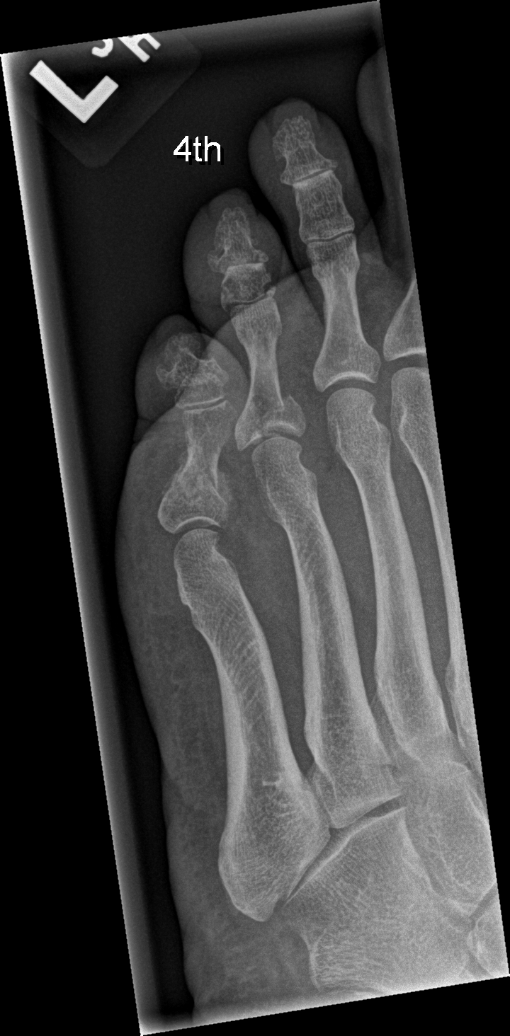

[x toes lat left (1 of 2)]
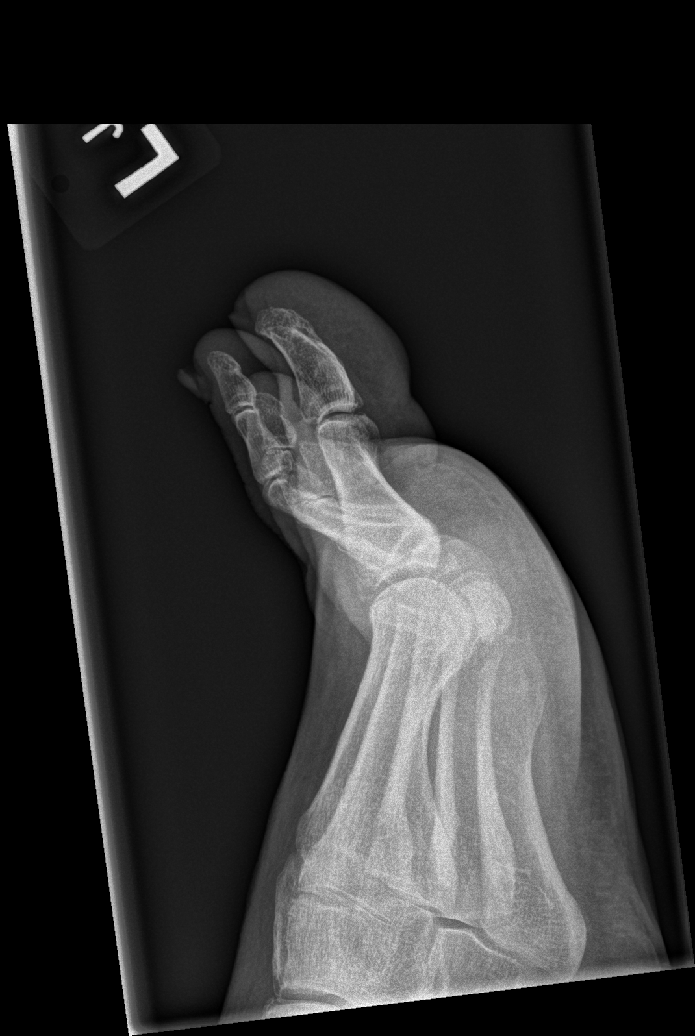

[x toes lat left (2 of 2)]
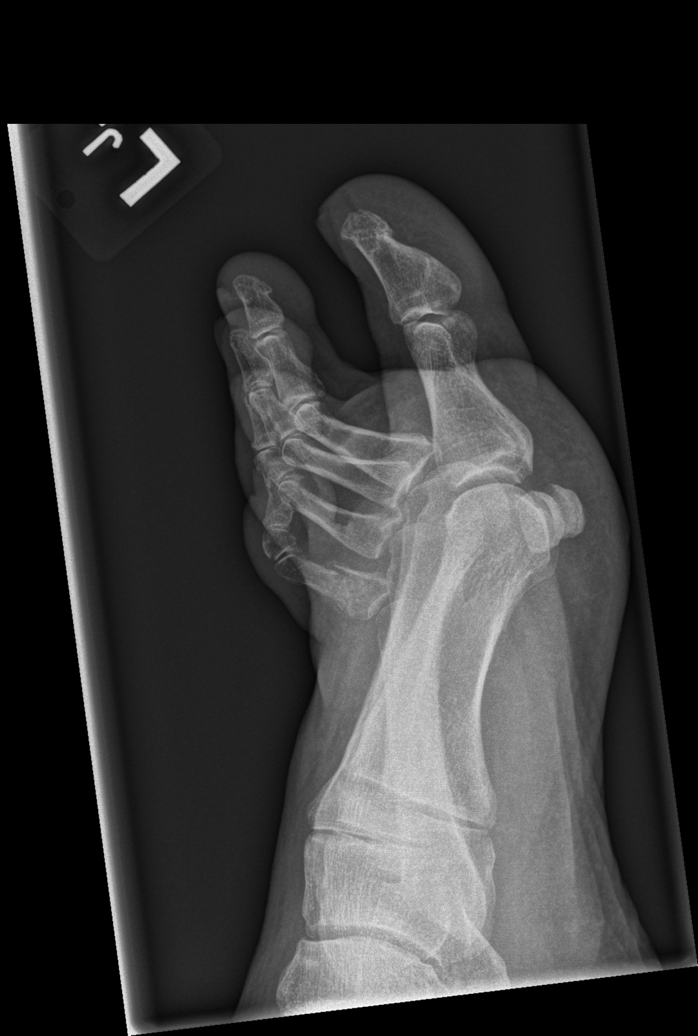

[x toes ap left (2 of 2)]
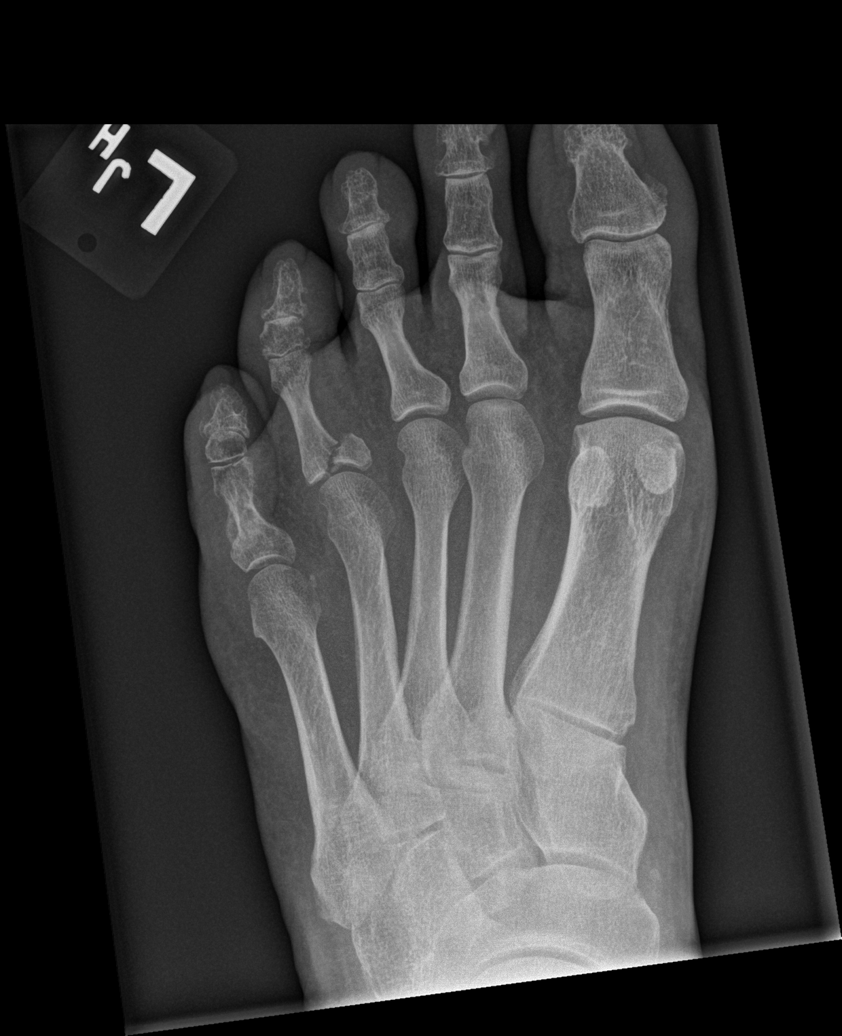

[x toes obl left (2 of 2)]
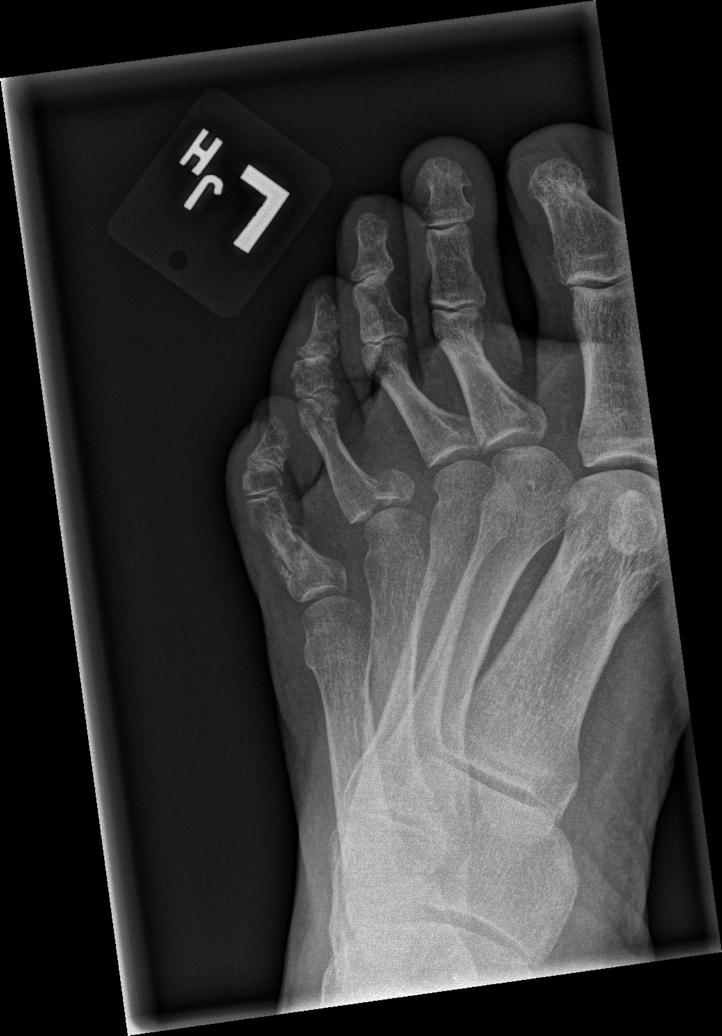

[6 of 6 positions shown; findings below may reference images not displayed]

FINDINGS: There is an oblique fracture through the base of the fourth proximal
phalanx involving the articular surface proximally. Additionally a
chip fracture from the base of the fourth middle phalanx is noted.
Healing fifth proximal phalangeal fracture is noted as well.
IMPRESSION: Fractures of the fourth and fifth toes as described.

## 2019-10-21 ENCOUNTER — Ambulatory Visit: Payer: 59 | Admitting: Cardiology

## 2019-11-11 ENCOUNTER — Ambulatory Visit: Payer: 59 | Admitting: Cardiology

## 2019-11-24 ENCOUNTER — Ambulatory Visit: Payer: 59 | Admitting: Cardiology

## 2019-11-24 ENCOUNTER — Other Ambulatory Visit: Payer: Self-pay

## 2019-11-24 ENCOUNTER — Encounter: Payer: Self-pay | Admitting: Cardiology

## 2019-11-24 VITALS — BP 138/84 | HR 93 | Resp 16 | Ht 72.0 in | Wt 247.0 lb

## 2019-11-24 DIAGNOSIS — I493 Ventricular premature depolarization: Secondary | ICD-10-CM

## 2019-11-24 DIAGNOSIS — I340 Nonrheumatic mitral (valve) insufficiency: Secondary | ICD-10-CM

## 2019-11-24 NOTE — Progress Notes (Signed)
Patient is here for follow up visit.  Subjective:   '@Patient'  ID: Kirk Hayes, male    DOB: 05-06-1960, 59 y.o.   MRN: [id number]   Chief complaint: Palpitations  HPI   59 y/o male with monoccloncal gammopathy, scleromyxedema, grade III/IV primary mitral regurgitation, aortic valve abnormality.  Patient underwent stress echocardiogram that showed excellent exercise capacity (10 METS), III/IV MR and mildly elevated pulmonary artery systolic pressure 39 mmHg. Aortic valve shows mobile appearance.  Mother appearance has stayed unchanged on 2 teas 1 year apart, concern remains if this is only myxomatous changes, or whether he truly represents papillary fibroblastoma.  Definitive diagnosis would only occur under microscopy.  Patient was interested in pursuing early mitral valve repair.  He saw Dr. Roxy Manns for consultation visit.  Dr. Roxy Manns feels that the aortic valve mass should be addressed at the same time as mitral valve repair.  In the meantime, patient has been doing well without any overt symptoms of exertional dyspnea. His symptomatic PVC's have also improved. He is thinking of contacting Dr. Roxy Manns soon for surgery.   Current Outpatient Medications on File Prior to Visit  Medication Sig Dispense Refill  . acetaminophen (TYLENOL) 500 MG tablet Take 500 mg by mouth every 6 (six) hours as needed for moderate pain or headache.     . Ascorbic Acid (VITAMIN C) 1000 MG tablet Take 1,000 mg by mouth daily.    Marland Kitchen aspirin EC 81 MG tablet Take 81 mg by mouth daily.    Marland Kitchen ibuprofen (ADVIL) 200 MG tablet Take 200 mg by mouth every 8 (eight) hours as needed (for pain.).    Marland Kitchen LORazepam (ATIVAN) 1 MG tablet Take 1 mg by mouth at bedtime as needed for anxiety or sleep.     . Multiple Vitamins-Minerals (MULTIVITAMIN WITH MINERALS) tablet Take 1 tablet by mouth daily. Multivitamin for Adults 50+    . selenium 200 MCG TABS tablet Take 200 mcg by mouth 2 (two) times a week.    . zolpidem (AMBIEN) 10 MG  tablet Take 10 mg by mouth at bedtime as needed.     No current facility-administered medications on file prior to visit.    Cardiovascular studies:  EKG 08/11/2019: Sinus rhythm 76 bpm. Ventricular trigeminy.  Stress echocardiogram 07/05/2019: 1. This is a negative stress echocardiogram for ischemia.  2. This is a low risk study.   FINDINGS  Exam Protocol: The patient exercised on a treadmill according to a Bruce  protocol.    Patient Performance: The patient exercised for 8 minutes and 55 seconds,  achieving 10 METS. The maximum stage achieved was III of the Bruce  protocol. The heart rate at peak stress was 172 bpm. The target heart rate  was calculated to be 138 bpm. The  percentage of maximum predicted heart rate achieved was 105.7 %. The  baseline blood pressure was 121/97 mmHg. The blood pressure at peak stress  was 201/99 mmHg. The patient developed no symptoms during the stress exam.  Excellent exercise tolerance.    EKG: Resting EKG showed normal sinus rhythm at a rate of 94 beats per  minute with frequent premature ventricular contractions. The patient  developed no abnormal EKG findings during exercise. The arrhythmia  resolved spontaneously in recovery.     2D Echo Findings: Baseline regional wall motion abnormalities were not  present. There were no stress-induced wall motion abnormalities. This is a  negative stress echocardiogram for ischemia.    Pre-exercise: Trace TR. Inadequate TR  jet to calculate PASP  Post-exercise: Mild to moderate TR. Estimated PASP 38 mmHg   EKG 06/20/2019: Sinus rhythm 85 bpm. Left atrial enlargement. Ventricular trigeminy.  TEE 06/07/2019: 1. Left ventricular ejection fraction, by estimation, is 55 to 60%. The left ventricle has normal function. Left ventricular endocardial border not optimally defined to evaluate regional wall motion.  2. Right ventricular systolic function is normal. The right ventricular size is normal.   3. Left atrial size was At least moderately dilated. Volume likely underestimated.. No left atrial/left atrial appendage thrombus was detected.  4. 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 One eccentric jet is directed anteriorly, while the otehr eccentric jet is directed posteriorly. The mitral valve is myxomatous. Severe mitral valve regurgitation.  5. Myxomatous degeneration of aortic valve, especially noncoronary cusp.  No definite evidence of mobile mass. No regurgitation seen.  6. Evidence of atrial level shunting detected by color flow Doppler.  Agitated saline contrast bubble study was positive with shunting observed within 3-6 cardiac cycles suggestive of interatrial shunt.  7. Compared to previous study on 04/13/2018, mitral regurgitation severity increased from moderate to severe. Patent foramen ovale is new finding.   24-hour Holter monitor 02/14/2019:  Baseline rhythm sinus. Heart rate 46 left 129 bpm. Average heart rate 71 bpm. 3.3% ventricular ectopy, mostly single PVCs, occasional trigeminy.  Rare supraventricular ectopy. No other arrhythmia noted.   Cardiac MRI 10/07/2018: 1.  Borderline dilated LV with EF 56%, normal wall motion. 2.  Normal RV size and systolic function, EF 44%. 3. No myocardial LGE, so no definitive evidence for prior MI, infiltrative disease, or myocarditis. 4. I cannot identify a definite aortic valve lesion on this study. If there is a small fibroelastoma on a moving valve, this study likely lacks appropriate temporal resolution and TEE would be a better way to investigate.  There is one view where turbulence isnoted across the valve and may represent the area of abnormality. 5. Posterior mitral leaflet prolapse with highly eccentric anterior MR, looks moderate.  Coronary angiography 05/11/2018: Left dominant coronary circulation with no significant disease LVEDP mildly elevated.  Recent Labs: 10/07/2018:  Creatinine 1.07. EGFR 60.    05/11/2018: Glucose 97, BUN/Cr 15/0.97.  Na/K 143/3.8. Rest of the CMP normal H/H 16/48.9. MCV 98.2. Platelets 204. RBC 4.98. WBC 5.5.   Review of Systems  Cardiovascular: Negative for chest pain, dyspnea on exertion, leg swelling, palpitations and syncope.        Objective:    Vitals:   11/24/19 1124  BP: 138/84  Pulse: 93  Resp: 16  SpO2: 99%    Physical Exam Vitals and nursing note reviewed.  Constitutional:      General: He is not in acute distress. Neck:     Vascular: No JVD.  Cardiovascular:     Rate and Rhythm: Normal rate and regular rhythm.     Pulses: Intact distal pulses.     Heart sounds: Murmur heard. High-pitched blowing holosystolic murmur is present with a grade of 4/6 at the apex.   Pulmonary:     Effort: Pulmonary effort is normal.     Breath sounds: Normal breath sounds. No wheezing or rales.         Assessment & Recommendations:    59 y/o male with monoccloncal gammopathy, scleromyxedema, grade III/IV primary mitral regurgitation, aortic valve abnormality.  Primary mitral regurgitation, aortic valve abnormality: Grade III/IV with mild exercise induced pulmonary hypertension (PASP 38 mmHg). Unchanged on two TEE's a year apart.  While it is less likely to be papillary fibroelastoma, more likely to represent myxomatous aortic valve, definitive diagnosis could only be made postoperatively. Dr. Roxy Manns feels that the aortic valve mass should be addressed at the same time as mitral valve repair.  I am in agreement.  By itself, his MR is moderate, with only mild pulmonary hypertension with exertion. I will defer the timing and nature of surgery decision to Dr. Roxy Manns and the patient. I will repeat echocardiogram in 6 months, with or without the surgery.  Frequent PVC's: Resolved. Watchful management for now.  F/u w/me in 6 months after repeat echocardiogram.  [name], MD [facility] Pager: [phone] Office:  [phone] If no answer Cell [phone]

## 2020-04-26 ENCOUNTER — Other Ambulatory Visit: Payer: Self-pay | Admitting: Cardiology

## 2020-04-26 DIAGNOSIS — I340 Nonrheumatic mitral (valve) insufficiency: Secondary | ICD-10-CM

## 2020-05-14 ENCOUNTER — Ambulatory Visit: Payer: 59

## 2020-05-14 ENCOUNTER — Other Ambulatory Visit: Payer: Self-pay

## 2020-05-14 DIAGNOSIS — I340 Nonrheumatic mitral (valve) insufficiency: Secondary | ICD-10-CM

## 2020-05-17 NOTE — Progress Notes (Signed)
Reviewed and compared serial echocardiograms. No significant change noted. Recommend clinical follow up with office visit.  Thanks MJP

## 2020-05-18 NOTE — Progress Notes (Signed)
Patient has been informed.

## 2020-12-05 ENCOUNTER — Ambulatory Visit: Payer: 59 | Admitting: Cardiology

## 2020-12-05 ENCOUNTER — Other Ambulatory Visit: Payer: Self-pay

## 2020-12-05 ENCOUNTER — Encounter: Payer: Self-pay | Admitting: Cardiology

## 2020-12-05 VITALS — BP 116/77 | HR 71 | Temp 98.2°F | Resp 16 | Ht 72.0 in | Wt 250.0 lb

## 2020-12-05 DIAGNOSIS — I341 Nonrheumatic mitral (valve) prolapse: Secondary | ICD-10-CM

## 2020-12-05 DIAGNOSIS — Q2112 Patent foramen ovale: Secondary | ICD-10-CM

## 2020-12-05 DIAGNOSIS — Q211 Atrial septal defect: Secondary | ICD-10-CM

## 2020-12-05 DIAGNOSIS — I358 Other nonrheumatic aortic valve disorders: Secondary | ICD-10-CM

## 2020-12-05 DIAGNOSIS — I340 Nonrheumatic mitral (valve) insufficiency: Secondary | ICD-10-CM

## 2020-12-05 NOTE — Telephone Encounter (Signed)
From pt

## 2020-12-05 NOTE — Progress Notes (Signed)
Patient is here for follow up visit.  Subjective:   '@Patient'  ID: Kirk Hayes, male    DOB: 1961-01-19, 60 y.o.   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  He remains completely symptomatic.  He has undergone exercise echocardiogram (06/2019) that showed excellent exercise capacity.  There is only minimal increased coronary artery cell pressure on exercise echocardiogram at 38 mmHg.  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  However, subsequent TEE over a year apart has shown stability, raising the possibility that this was merely myxomatous degeneration of the aortic valve without any significant regurgitation.  Patient has now moved to [place], [place], which is closer to [facility].  He is inquiring about any benefit in pursuing early mitral valve repair.   Current Outpatient Medications on File Prior to Visit  Medication Sig Dispense Refill   acetaminophen (TYLENOL) 500 MG tablet Take 500 mg by mouth every 6 (six) hours as needed for moderate pain or headache.      Ascorbic Acid (VITAMIN C) 1000 MG tablet Take 1,000 mg by mouth daily.     aspirin EC 81 MG tablet Take 81 mg by mouth daily.     ibuprofen (ADVIL) 200 MG tablet Take 200 mg by mouth every 8 (eight) hours as needed (for pain.).     LORazepam (ATIVAN) 1 MG tablet Take 1 mg by mouth at bedtime as needed for anxiety or sleep.      Multiple Vitamins-Minerals (MULTIVITAMIN WITH MINERALS) tablet Take 1 tablet by mouth daily. Multivitamin for Adults 50+     selenium 200 MCG TABS tablet Take 200 mcg by mouth 2 (two) times a week.     zolpidem (AMBIEN) 10 MG tablet Take 10 mg by mouth at bedtime as needed.     No  current facility-administered medications on file prior to visit.    Cardiovascular studies:  EKG 12/05/2020: Sinus rhythm 77 bpm Normal EKG  Echocardiogram 05/14/2020:  Normal LV systolic function with visual EF 55-60%. Left ventricle cavity  is normal in size. Normal global wall motion. Normal diastolic filling  pattern, normal LAP.  Native mitral valve, bileaflet prolapse. Eccentric jet directed along the  anterior mitral valve leaflet. Moderate to severe mitral regurgitation.  Mild tricuspid regurgitation. No evidence of pulmonary hypertension.  Compared to prior study dated 03/15/2018: LVEF improved from 49% to  55-60%, moderate MR is now moderate to severe. Otherwise, no significant  change compared to prior study.  Recommend TEE if clinically indicated to quantify the severity of MR and  to evaluate the mitral apparatus.    Stress echocardiogram 07/05/2019: 1. This is a negative stress echocardiogram for ischemia.  2. This is a low risk study.   FINDINGS  Exam Protocol: The patient exercised on a treadmill according to a Bruce  protocol.     Patient Performance: The patient exercised for 8 minutes and 55 seconds,  achieving 10 METS. The maximum stage achieved was III of the Bruce  protocol. The heart rate at peak stress was 172 bpm. The target heart rate  was calculated to be 138 bpm. The  percentage of maximum predicted heart rate achieved was 105.7 %. The  baseline blood pressure was 121/97 mmHg. The blood pressure at peak stress  was  201/99 mmHg. The patient developed no symptoms during the stress exam.  Excellent exercise tolerance.     EKG: Resting EKG showed normal sinus rhythm at a rate of 94 beats per  minute with frequent premature ventricular contractions. The patient  developed no abnormal EKG findings during exercise. The arrhythmia  resolved spontaneously in recovery.      2D Echo Findings: Baseline regional wall motion abnormalities were not  present.  There were no stress-induced wall motion abnormalities. This is a  negative stress echocardiogram for ischemia.     Pre-exercise: Trace TR. Inadequate TR jet to calculate PASP  Post-exercise: Mild to moderate TR. Estimated PASP 38 mmHg    TEE 06/07/2019: 1. Left ventricular ejection fraction, by estimation, is 55 to 60%. The left ventricle has normal function. Left ventricular endocardial border not optimally defined to evaluate regional wall motion.  2. Right ventricular systolic function is normal. The right ventricular size is normal.  3. Left atrial size was At least moderately dilated. Volume likely underestimated.. No left atrial/left atrial appendage thrombus was detected.  4. 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 One eccentric jet is directed anteriorly, while the otehr eccentric jet is directed posteriorly. The mitral valve is myxomatous. Severe mitral valve regurgitation.  5. Myxomatous degeneration of aortic valve, especially noncoronary cusp.  No definite evidence of mobile mass. No regurgitation seen.  6. Evidence of atrial level shunting detected by color flow Doppler.  Agitated saline contrast bubble study was positive with shunting observed within 3-6 cardiac cycles suggestive of interatrial shunt.  7. Compared to previous study on 04/13/2018, mitral regurgitation severity increased from moderate to severe. Patent foramen ovale is new finding.   24-hour Holter monitor 02/14/2019:  Baseline rhythm sinus.  Heart rate 46 left 129 bpm.  Average heart rate 71 bpm.  3.3% ventricular ectopy, mostly single PVCs, occasional trigeminy.   Rare supraventricular ectopy.  No other arrhythmia noted.   Cardiac MRI 10/07/2018: 1.  Borderline dilated LV with EF 56%, normal wall motion. 2.  Normal RV size and systolic function, EF 30%. 3. No myocardial LGE, so no definitive evidence for prior MI, infiltrative disease, or myocarditis. 4. I cannot identify a definite aortic valve lesion on  this study. If there is a small fibroelastoma on a moving valve, this study likely lacks appropriate temporal resolution and TEE would be a better way to investigate.  There is one view where turbulence isnoted across the valve and may represent the area of abnormality. 5. Posterior mitral leaflet prolapse with highly eccentric anterior MR, looks moderate.  Coronary angiography 05/11/2018: Left dominant coronary circulation with no significant disease LVEDP mildly elevated.  Recent Labs: 10/07/2018:  Creatinine 1.07. EGFR 60.   05/11/2018: Glucose 97, BUN/Cr 15/0.97.  Na/K 143/3.8. Rest of the CMP normal H/H 16/48.9. MCV 98.2. Platelets 204. RBC 4.98. WBC 5.5.   Review of Systems  Cardiovascular:  Negative for chest pain, dyspnea on exertion, leg swelling, palpitations and syncope.       Objective:    Vitals:   12/05/20 1102  BP: 116/77  Pulse: 71  Resp: 16  Temp: 98.2 F (36.8 C)  SpO2: 98%    Physical Exam Vitals and nursing note reviewed.  Constitutional:      General: He is not in acute distress. Neck:     Vascular: No JVD.  Cardiovascular:     Rate and Rhythm: Normal rate and regular rhythm.     Pulses: Intact distal pulses.  Heart sounds: Murmur heard.  High-pitched blowing holosystolic murmur is present with a grade of 4/6 at the apex.  Pulmonary:     Effort: Pulmonary effort is normal.     Breath sounds: Normal breath sounds. No wheezing or rales.        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  Primary mitral regurgitation, aortic valve abnormality: Severe mitral regurgitation.  His MR murmur is more prominent and higher pitched today than before.  He remains completely asymptomatic. He does have incidental finding of patent foramen ovale and myxomatous degeneration of aortic valve without  regurgitation. Patient has moved closer to [place], we do not have availability of minimally invasive mitral repair at [facility] at this time. I referred the patient to [facility].  If surgical repair is could be considered there, it would be beneficial for him to undergo necessary imaging, including TEE at [facility].  For any reason, he is not able to see a surgeon or structural interventionalists at [facility] in the near future, I am happy to perform TEE at [facility] and share the images with [facility] team.   Frequent PVC's: Resolved. Watchful management for now.   [name], MD [facility] Pager: [phone] Office: [phone] If no answer Cell [phone]

## 2020-12-06 ENCOUNTER — Encounter: Payer: Self-pay | Admitting: Cardiology

## 2020-12-06 DIAGNOSIS — I341 Nonrheumatic mitral (valve) prolapse: Secondary | ICD-10-CM | POA: Insufficient documentation

## 2020-12-06 DIAGNOSIS — Q211 Atrial septal defect: Secondary | ICD-10-CM | POA: Insufficient documentation

## 2020-12-06 DIAGNOSIS — Q2112 Patent foramen ovale: Secondary | ICD-10-CM | POA: Insufficient documentation

## 2020-12-06 DIAGNOSIS — I358 Other nonrheumatic aortic valve disorders: Secondary | ICD-10-CM | POA: Insufficient documentation

## 2020-12-20 NOTE — Telephone Encounter (Signed)
Kirk Hayes, can you follow up please> Needs referral for mitral valve repair for mitral regurgitation.  Baxter Flattery, please let the patient know that we will follow up.  Thanks MJP

## 2020-12-20 NOTE — Telephone Encounter (Signed)
From pt
